# Patient Record
Sex: Male | Born: 1940 | Race: White | Hispanic: No | Smoking: Former smoker
Health system: Southern US, Community
[De-identification: ages and names within clinical notes are randomized; demographics above are authoritative.]

## PROBLEM LIST (undated history)

## (undated) DIAGNOSIS — I10 Essential (primary) hypertension: Secondary | ICD-10-CM

## (undated) DIAGNOSIS — I639 Cerebral infarction, unspecified: Secondary | ICD-10-CM

## (undated) DIAGNOSIS — E119 Type 2 diabetes mellitus without complications: Secondary | ICD-10-CM

## (undated) DIAGNOSIS — E785 Hyperlipidemia, unspecified: Secondary | ICD-10-CM

---

## 2008-06-30 ENCOUNTER — Ambulatory Visit: Payer: Self-pay | Admitting: Gastroenterology

## 2008-08-23 ENCOUNTER — Ambulatory Visit: Payer: Self-pay | Admitting: Cardiology

## 2008-10-04 ENCOUNTER — Ambulatory Visit: Payer: Self-pay | Admitting: Surgery

## 2008-10-11 ENCOUNTER — Inpatient Hospital Stay: Payer: Self-pay | Admitting: Surgery

## 2010-10-09 ENCOUNTER — Ambulatory Visit: Payer: Self-pay | Admitting: Family Medicine

## 2011-10-20 ENCOUNTER — Inpatient Hospital Stay: Payer: Self-pay | Admitting: Internal Medicine

## 2011-10-20 LAB — COMPREHENSIVE METABOLIC PANEL
Albumin: 3.1 g/dL — ABNORMAL LOW (ref 3.4–5.0)
Anion Gap: 15 (ref 7–16)
Calcium, Total: 9.1 mg/dL (ref 8.5–10.1)
Chloride: 97 mmol/L — ABNORMAL LOW (ref 98–107)
Creatinine: 1.64 mg/dL — ABNORMAL HIGH (ref 0.60–1.30)
EGFR (African American): 49 — ABNORMAL LOW

## 2011-10-20 LAB — URINALYSIS, COMPLETE
Bacteria: NONE SEEN
Blood: NEGATIVE
Glucose,UR: >=500 mg/dL (ref 0–75)
Hyaline Cast: 21
RBC,UR: NONE SEEN /HPF (ref 0–5)
Squamous Epithelial: <1

## 2011-10-20 LAB — CBC
MCV: 88 fL (ref 80–100)
Platelet: 351 10*3/uL (ref 150–440)
RDW: 12.8 % (ref 11.5–14.5)

## 2011-10-20 LAB — CK TOTAL AND CKMB (NOT AT ARMC): CK, Total: 51 U/L (ref 35–232)

## 2011-10-20 LAB — PRO B NATRIURETIC PEPTIDE: B-Type Natriuretic Peptide: 181 pg/mL — ABNORMAL HIGH (ref 0–125)

## 2011-10-20 LAB — AMYLASE: Amylase: 57 U/L (ref 25–115)

## 2011-10-21 LAB — CBC WITH DIFFERENTIAL/PLATELET
Basophil %: 0.2 %
Eosinophil %: 0.2 %
MCHC: 31.9 g/dL — ABNORMAL LOW (ref 32.0–36.0)
MCV: 88 fL (ref 80–100)
Monocyte %: 8.4 %

## 2011-10-21 LAB — TROPONIN I: Troponin-I: <0.02 ng/mL

## 2011-10-21 LAB — BASIC METABOLIC PANEL
BUN: 38 mg/dL — ABNORMAL HIGH (ref 7–18)
Potassium: 4.5 mmol/L (ref 3.5–5.1)

## 2011-10-21 LAB — CK TOTAL AND CKMB (NOT AT ARMC): CK, Total: 250 U/L — ABNORMAL HIGH (ref 35–232)

## 2011-10-22 DIAGNOSIS — I4891 Unspecified atrial fibrillation: Secondary | ICD-10-CM

## 2011-10-22 LAB — CBC WITH DIFFERENTIAL/PLATELET
Basophil #: 0.1 10*3/uL (ref 0.0–0.1)
Eosinophil #: 0.1 10*3/uL (ref 0.0–0.7)
Eosinophil %: 0.4 %
HGB: 11 g/dL — ABNORMAL LOW (ref 13.0–18.0)
Lymphocyte %: 4.2 %
MCH: 28.4 pg (ref 26.0–34.0)
MCHC: 32.1 g/dL (ref 32.0–36.0)
Monocyte #: 2 x10 3/mm — ABNORMAL HIGH (ref 0.2–1.0)
Monocyte %: 8.4 %
Neutrophil #: 20.3 10*3/uL — ABNORMAL HIGH (ref 1.4–6.5)
Neutrophil %: 86.8 %
Platelet: 324 10*3/uL (ref 150–440)
WBC: 23.4 10*3/uL — ABNORMAL HIGH (ref 3.8–10.6)

## 2011-10-22 LAB — BASIC METABOLIC PANEL
Anion Gap: 12 (ref 7–16)
Calcium, Total: 8.4 mg/dL — ABNORMAL LOW (ref 8.5–10.1)
Chloride: 99 mmol/L (ref 98–107)
Co2: 23 mmol/L (ref 21–32)
EGFR (African American): 33 — ABNORMAL LOW
EGFR (Non-African Amer.): 29 — ABNORMAL LOW
Glucose: 246 mg/dL — ABNORMAL HIGH (ref 65–99)
Osmolality: 290 (ref 275–301)
Sodium: 134 mmol/L — ABNORMAL LOW (ref 136–145)

## 2011-10-23 ENCOUNTER — Inpatient Hospital Stay (HOSPITAL_COMMUNITY): Payer: Medicare Other

## 2011-10-23 ENCOUNTER — Encounter (HOSPITAL_COMMUNITY): Payer: Self-pay | Admitting: Certified Registered"

## 2011-10-23 ENCOUNTER — Inpatient Hospital Stay (HOSPITAL_COMMUNITY)
Admission: EM | Admit: 2011-10-23 | Discharge: 2011-10-30 | DRG: 853 | Disposition: A | Discharge status: Home Health | Payer: Medicare Other | Source: Other Acute Inpatient Hospital | Attending: Internal Medicine | Admitting: Internal Medicine

## 2011-10-23 ENCOUNTER — Encounter (HOSPITAL_COMMUNITY)
Admission: EM | Disposition: A | Discharge status: Home / Self Care | Payer: Self-pay | Source: Other Acute Inpatient Hospital | Attending: Internal Medicine

## 2011-10-23 ENCOUNTER — Encounter (HOSPITAL_COMMUNITY): Payer: Self-pay | Admitting: Adult Health

## 2011-10-23 ENCOUNTER — Inpatient Hospital Stay (HOSPITAL_COMMUNITY): Payer: Medicare Other | Admitting: Certified Registered"

## 2011-10-23 DIAGNOSIS — E785 Hyperlipidemia, unspecified: Secondary | ICD-10-CM | POA: Diagnosis present

## 2011-10-23 DIAGNOSIS — R6520 Severe sepsis without septic shock: Secondary | ICD-10-CM | POA: Diagnosis present

## 2011-10-23 DIAGNOSIS — I4891 Unspecified atrial fibrillation: Secondary | ICD-10-CM

## 2011-10-23 DIAGNOSIS — I10 Essential (primary) hypertension: Secondary | ICD-10-CM | POA: Diagnosis present

## 2011-10-23 DIAGNOSIS — E119 Type 2 diabetes mellitus without complications: Secondary | ICD-10-CM | POA: Diagnosis present

## 2011-10-23 DIAGNOSIS — J90 Pleural effusion, not elsewhere classified: Secondary | ICD-10-CM | POA: Diagnosis present

## 2011-10-23 DIAGNOSIS — E876 Hypokalemia: Secondary | ICD-10-CM | POA: Diagnosis not present

## 2011-10-23 DIAGNOSIS — A419 Sepsis, unspecified organism: Principal | ICD-10-CM | POA: Diagnosis present

## 2011-10-23 DIAGNOSIS — J189 Pneumonia, unspecified organism: Secondary | ICD-10-CM | POA: Diagnosis present

## 2011-10-23 DIAGNOSIS — J869 Pyothorax without fistula: Secondary | ICD-10-CM

## 2011-10-23 DIAGNOSIS — E871 Hypo-osmolality and hyponatremia: Secondary | ICD-10-CM | POA: Diagnosis not present

## 2011-10-23 DIAGNOSIS — D649 Anemia, unspecified: Secondary | ICD-10-CM | POA: Diagnosis present

## 2011-10-23 DIAGNOSIS — Z87891 Personal history of nicotine dependence: Secondary | ICD-10-CM

## 2011-10-23 DIAGNOSIS — N179 Acute kidney failure, unspecified: Secondary | ICD-10-CM | POA: Diagnosis present

## 2011-10-23 DIAGNOSIS — J9600 Acute respiratory failure, unspecified whether with hypoxia or hypercapnia: Secondary | ICD-10-CM | POA: Diagnosis present

## 2011-10-23 HISTORY — DX: Hyperlipidemia, unspecified: E78.5

## 2011-10-23 HISTORY — DX: Type 2 diabetes mellitus without complications: E11.9

## 2011-10-23 HISTORY — DX: Essential (primary) hypertension: I10

## 2011-10-23 HISTORY — PX: DECORTICATION: SHX5101

## 2011-10-23 HISTORY — PX: PLEURAL EFFUSION DRAINAGE: SHX5099

## 2011-10-23 HISTORY — PX: THORACOTOMY: SHX5074

## 2011-10-23 HISTORY — PX: FLEXIBLE BRONCHOSCOPY: SHX5094

## 2011-10-23 LAB — CBC WITH DIFFERENTIAL/PLATELET
Eosinophil #: 0.1 10*3/uL (ref 0.0–0.7)
HCT: 32.4 % — ABNORMAL LOW (ref 40.0–52.0)
HGB: 10.3 g/dL — ABNORMAL LOW (ref 13.0–18.0)
Lymphocyte #: 1.1 10*3/uL (ref 1.0–3.6)
Lymphocyte %: 4.9 %
MCH: 28.2 pg (ref 26.0–34.0)
MCHC: 31.8 g/dL — ABNORMAL LOW (ref 32.0–36.0)
MCV: 89 fL (ref 80–100)

## 2011-10-23 LAB — CORTISOL: Cortisol, Plasma: 28.2 ug/dL

## 2011-10-23 LAB — URINALYSIS, ROUTINE W REFLEX MICROSCOPIC
Glucose, UA: NEGATIVE mg/dL
Ketones, ur: NEGATIVE mg/dL
Leukocytes, UA: NEGATIVE
Protein, ur: 30 mg/dL — AB
pH: 5 (ref 5.0–8.0)

## 2011-10-23 LAB — URINE MICROSCOPIC-ADD ON

## 2011-10-23 LAB — COMPREHENSIVE METABOLIC PANEL
Alkaline Phosphatase: 89 U/L (ref 39–117)
BUN: 75 mg/dL — ABNORMAL HIGH (ref 6–23)
CO2: 21 mEq/L (ref 19–32)
Calcium: 8.9 mg/dL (ref 8.4–10.5)
GFR calc Af Amer: 39 mL/min — ABNORMAL LOW (ref >90)
GFR calc non Af Amer: 33 mL/min — ABNORMAL LOW (ref >90)
Glucose, Bld: 294 mg/dL — ABNORMAL HIGH (ref 70–99)
Total Protein: 7.3 g/dL (ref 6.0–8.3)

## 2011-10-23 LAB — POCT I-STAT 3, ART BLOOD GAS (G3+)
Acid-base deficit: 3 mmol/L — ABNORMAL HIGH (ref 0.0–2.0)
Bicarbonate: 22.7 mEq/L (ref 20.0–24.0)
TCO2: 24 mmol/L (ref 0–100)
pO2, Arterial: 90 mmHg (ref 80.0–100.0)

## 2011-10-23 LAB — POCT I-STAT 4, (NA,K, GLUC, HGB,HCT)
Glucose, Bld: 295 mg/dL — ABNORMAL HIGH (ref 70–99)
Hemoglobin: 10.2 g/dL — ABNORMAL LOW (ref 13.0–17.0)
Potassium: 4.4 mEq/L (ref 3.5–5.1)

## 2011-10-23 LAB — BASIC METABOLIC PANEL
Anion Gap: 11 (ref 7–16)
Calcium, Total: 8.6 mg/dL (ref 8.5–10.1)
Co2: 22 mmol/L (ref 21–32)
EGFR (Non-African Amer.): 27 — ABNORMAL LOW
Glucose: 270 mg/dL — ABNORMAL HIGH (ref 65–99)
Osmolality: 293 (ref 275–301)
Potassium: 4.6 mmol/L (ref 3.5–5.1)

## 2011-10-23 LAB — CBC
HCT: 33.8 % — ABNORMAL LOW (ref 39.0–52.0)
Hemoglobin: 10.9 g/dL — ABNORMAL LOW (ref 13.0–17.0)
MCH: 28.2 pg (ref 26.0–34.0)
MCHC: 32.2 g/dL (ref 30.0–36.0)

## 2011-10-23 LAB — MRSA PCR SCREENING: MRSA by PCR: NEGATIVE

## 2011-10-23 LAB — LACTIC ACID, PLASMA: Lactic Acid, Venous: 1 mmol/L (ref 0.5–2.2)

## 2011-10-23 LAB — PHOSPHORUS: Phosphorus: 3.9 mg/dL (ref 2.3–4.6)

## 2011-10-23 LAB — TYPE AND SCREEN: Antibody Screen: NEGATIVE

## 2011-10-23 LAB — PROCALCITONIN: Procalcitonin: 11.1 ng/mL

## 2011-10-23 SURGERY — BRONCHOSCOPY, FLEXIBLE
Anesthesia: General | Site: Chest | Laterality: Right | Wound class: Clean Contaminated

## 2011-10-23 MED ORDER — MIDAZOLAM HCL 5 MG/5ML IJ SOLN
INTRAMUSCULAR | Status: DC | PRN
  Administered 2011-10-23 (×2): 2 mg via INTRAVENOUS

## 2011-10-23 MED ORDER — FENTANYL CITRATE 0.05 MG/ML IJ SOLN
25.0000 ug | INTRAMUSCULAR | Status: DC | PRN
  Administered 2011-10-23: 25 ug via INTRAVENOUS

## 2011-10-23 MED ORDER — LIDOCAINE HCL (CARDIAC) 20 MG/ML IV SOLN
INTRAVENOUS | Status: DC | PRN
  Administered 2011-10-23: 100 mg via INTRAVENOUS

## 2011-10-23 MED ORDER — MIDAZOLAM BOLUS VIA INFUSION
1.0000 mg | INTRAVENOUS | Status: DC | PRN
  Filled 2011-10-23: qty 2

## 2011-10-23 MED ORDER — SUFENTANIL CITRATE 50 MCG/ML IV SOLN
INTRAVENOUS | Status: DC | PRN
  Administered 2011-10-23: 15 ug via INTRAVENOUS
  Administered 2011-10-23 (×2): 10 ug via INTRAVENOUS
  Administered 2011-10-23: 15 ug via INTRAVENOUS

## 2011-10-23 MED ORDER — FENTANYL CITRATE 0.05 MG/ML IJ SOLN
INTRAMUSCULAR | Status: DC | PRN
  Administered 2011-10-23: 50 ug via INTRAVENOUS
  Administered 2011-10-23 (×2): 100 ug via INTRAVENOUS

## 2011-10-23 MED ORDER — LINEZOLID 2 MG/ML IV SOLN
600.0000 mg | Freq: Two times a day (BID) | INTRAVENOUS | Status: DC
  Administered 2011-10-23 – 2011-10-29 (×12): 600 mg via INTRAVENOUS
  Filled 2011-10-23 (×15): qty 300

## 2011-10-23 MED ORDER — VECURONIUM BROMIDE 10 MG IV SOLR
INTRAVENOUS | Status: DC | PRN
  Administered 2011-10-23: 4 mg via INTRAVENOUS
  Administered 2011-10-23: 6 mg via INTRAVENOUS

## 2011-10-23 MED ORDER — INSULIN REGULAR HUMAN 100 UNIT/ML IJ SOLN
INTRAMUSCULAR | Status: DC
  Filled 2011-10-23: qty 1

## 2011-10-23 MED ORDER — INSULIN ASPART 100 UNIT/ML ~~LOC~~ SOLN: 0.0000 [IU] | SUBCUTANEOUS | Status: DC

## 2011-10-23 MED ORDER — HYDROMORPHONE HCL PF 1 MG/ML IJ SOLN: 0.2500 mg | INTRAMUSCULAR | Status: DC | PRN

## 2011-10-23 MED ORDER — CHLORHEXIDINE GLUCONATE 0.12 % MT SOLN
15.0000 mL | Freq: Two times a day (BID) | OROMUCOSAL | Status: DC
  Administered 2011-10-24 – 2011-10-25 (×4): 15 mL via OROMUCOSAL
  Filled 2011-10-23 (×4): qty 15

## 2011-10-23 MED ORDER — SODIUM CHLORIDE 0.9 % IV SOLN
50.0000 ug/h | INTRAVENOUS | Status: DC
  Administered 2011-10-23: 100 ug/h via INTRAVENOUS
  Filled 2011-10-23: qty 50

## 2011-10-23 MED ORDER — SUCCINYLCHOLINE CHLORIDE 20 MG/ML IJ SOLN
INTRAMUSCULAR | Status: DC | PRN
  Administered 2011-10-23: 100 mg via INTRAVENOUS

## 2011-10-23 MED ORDER — SENNOSIDES-DOCUSATE SODIUM 8.6-50 MG PO TABS
1.0000 | ORAL_TABLET | Freq: Every evening | ORAL | Status: DC | PRN
  Filled 2011-10-23: qty 1

## 2011-10-23 MED ORDER — INSULIN ASPART 100 UNIT/ML ~~LOC~~ SOLN
0.0000 [IU] | SUBCUTANEOUS | Status: DC
  Administered 2011-10-24: 12 [IU] via SUBCUTANEOUS
  Administered 2011-10-24 (×2): 8 [IU] via SUBCUTANEOUS
  Administered 2011-10-24: 12 [IU] via SUBCUTANEOUS
  Administered 2011-10-24: 4 [IU] via SUBCUTANEOUS
  Administered 2011-10-24 – 2011-10-25 (×2): 8 [IU] via SUBCUTANEOUS
  Administered 2011-10-25: 4 [IU] via SUBCUTANEOUS
  Administered 2011-10-25 (×2): 8 [IU] via SUBCUTANEOUS
  Administered 2011-10-25 (×2): 4 [IU] via SUBCUTANEOUS
  Administered 2011-10-26: 8 [IU] via SUBCUTANEOUS
  Administered 2011-10-26 (×3): 4 [IU] via SUBCUTANEOUS
  Administered 2011-10-26: 8 [IU] via SUBCUTANEOUS
  Administered 2011-10-26 (×2): 2 [IU] via SUBCUTANEOUS
  Administered 2011-10-27: 4 [IU] via SUBCUTANEOUS
  Administered 2011-10-27: 8 [IU] via SUBCUTANEOUS
  Administered 2011-10-27 (×2): 4 [IU] via SUBCUTANEOUS
  Administered 2011-10-27: 8 [IU] via SUBCUTANEOUS
  Administered 2011-10-28 (×2): 4 [IU] via SUBCUTANEOUS

## 2011-10-23 MED ORDER — FENTANYL CITRATE 0.05 MG/ML IJ SOLN
INTRAMUSCULAR | Status: AC
  Filled 2011-10-23: qty 2

## 2011-10-23 MED ORDER — SODIUM CHLORIDE 0.9 % IV SOLN
INTRAVENOUS | Status: DC
  Administered 2011-10-23: 15:00:00 via INTRAVENOUS

## 2011-10-23 MED ORDER — FENTANYL BOLUS VIA INFUSION
50.0000 ug | Freq: Four times a day (QID) | INTRAVENOUS | Status: DC | PRN
  Filled 2011-10-23: qty 100

## 2011-10-23 MED ORDER — VANCOMYCIN HCL 1000 MG IV SOLR
1500.0000 mg | INTRAVENOUS | Status: DC
  Filled 2011-10-23: qty 1500

## 2011-10-23 MED ORDER — PIPERACILLIN-TAZOBACTAM 3.375 G IVPB
3.3750 g | Freq: Three times a day (TID) | INTRAVENOUS | Status: DC
  Administered 2011-10-24 – 2011-10-29 (×17): 3.375 g via INTRAVENOUS
  Filled 2011-10-23 (×22): qty 50

## 2011-10-23 MED ORDER — OXYCODONE HCL 5 MG PO TABS
5.0000 mg | ORAL_TABLET | ORAL | Status: AC | PRN
Start: 2011-10-23 — End: 2011-10-24

## 2011-10-23 MED ORDER — 0.9 % SODIUM CHLORIDE (POUR BTL) OPTIME
TOPICAL | Status: DC | PRN
  Administered 2011-10-23: 1000 mL

## 2011-10-23 MED ORDER — BISACODYL 5 MG PO TBEC
10.0000 mg | DELAYED_RELEASE_TABLET | Freq: Every day | ORAL | Status: DC
  Administered 2011-10-25 – 2011-10-27 (×2): 10 mg via ORAL
  Filled 2011-10-23: qty 1
  Filled 2011-10-23: qty 2
  Filled 2011-10-23: qty 1
  Filled 2011-10-23: qty 2

## 2011-10-23 MED ORDER — BIOTENE DRY MOUTH MT LIQD
15.0000 mL | Freq: Four times a day (QID) | OROMUCOSAL | Status: DC
  Administered 2011-10-24 – 2011-10-25 (×7): 15 mL via OROMUCOSAL

## 2011-10-23 MED ORDER — ONDANSETRON HCL 4 MG/2ML IJ SOLN
4.0000 mg | Freq: Four times a day (QID) | INTRAMUSCULAR | Status: DC | PRN
  Administered 2011-10-24: 4 mg via INTRAVENOUS
  Filled 2011-10-23: qty 2

## 2011-10-23 MED ORDER — PROPOFOL 10 MG/ML IV EMUL
INTRAVENOUS | Status: DC | PRN
  Administered 2011-10-23: 100 mg via INTRAVENOUS

## 2011-10-23 MED ORDER — ACETAMINOPHEN 10 MG/ML IV SOLN
1000.0000 mg | Freq: Four times a day (QID) | INTRAVENOUS | Status: AC
  Administered 2011-10-24 (×4): 1000 mg via INTRAVENOUS
  Filled 2011-10-23 (×4): qty 100

## 2011-10-23 MED ORDER — LEVALBUTEROL HCL 0.63 MG/3ML IN NEBU
0.6300 mg | INHALATION_SOLUTION | Freq: Four times a day (QID) | RESPIRATORY_TRACT | Status: DC
  Administered 2011-10-24 – 2011-10-30 (×20): 0.63 mg via RESPIRATORY_TRACT
  Filled 2011-10-23 (×32): qty 3

## 2011-10-23 MED ORDER — DEXTROSE 10 % IV SOLN: INTRAVENOUS | Status: DC | PRN

## 2011-10-23 MED ORDER — SODIUM CHLORIDE 0.9 % IV SOLN
250.0000 mL | INTRAVENOUS | Status: DC | PRN
  Administered 2011-10-23: 19:00:00 via INTRAVENOUS

## 2011-10-23 MED ORDER — SODIUM CHLORIDE 0.9 % IV SOLN
INTRAVENOUS | Status: DC
  Administered 2011-10-24: 125 mL/h via INTRAVENOUS
  Administered 2011-10-25 – 2011-10-27 (×2): via INTRAVENOUS
  Administered 2011-10-28: 20 mL/h via INTRAVENOUS
  Administered 2011-10-28: 04:00:00 via INTRAVENOUS

## 2011-10-23 MED ORDER — PIPERACILLIN-TAZOBACTAM 3.375 G IVPB 30 MIN
3.3750 g | INTRAVENOUS | Status: AC
  Administered 2011-10-23: 3.375 g via INTRAVENOUS
  Filled 2011-10-23: qty 50

## 2011-10-23 MED ORDER — INSULIN ASPART 100 UNIT/ML ~~LOC~~ SOLN
0.0000 [IU] | SUBCUTANEOUS | Status: DC
  Administered 2011-10-23: 7 [IU] via SUBCUTANEOUS

## 2011-10-23 MED ORDER — SODIUM CHLORIDE 0.9 % IV SOLN
2.0000 mg/h | INTRAVENOUS | Status: DC
  Administered 2011-10-23: 2 mg/h via INTRAVENOUS
  Filled 2011-10-23: qty 10

## 2011-10-23 MED ORDER — PANTOPRAZOLE SODIUM 40 MG IV SOLR
40.0000 mg | Freq: Every day | INTRAVENOUS | Status: DC
  Administered 2011-10-24 – 2011-10-27 (×4): 40 mg via INTRAVENOUS
  Filled 2011-10-23 (×5): qty 40

## 2011-10-23 MED ORDER — DOCUSATE SODIUM 100 MG PO CAPS
100.0000 mg | ORAL_CAPSULE | Freq: Two times a day (BID) | ORAL | Status: DC
Start: 2011-10-23 — End: 2011-10-23
  Administered 2011-10-23: 100 mg via ORAL
  Filled 2011-10-23: qty 1

## 2011-10-23 MED ORDER — BISACODYL 10 MG RE SUPP: 10.0000 mg | Freq: Every day | RECTAL | Status: DC | PRN

## 2011-10-23 MED ORDER — OXYCODONE-ACETAMINOPHEN 5-325 MG PO TABS
1.0000 | ORAL_TABLET | ORAL | Status: DC | PRN
  Administered 2011-10-25: 2 via ORAL
  Administered 2011-10-25: 1 via ORAL
  Administered 2011-10-25: 2 via ORAL
  Administered 2011-10-25 – 2011-10-26 (×2): 1 via ORAL
  Administered 2011-10-26 – 2011-10-27 (×3): 2 via ORAL
  Administered 2011-10-27 (×2): 1 via ORAL
  Administered 2011-10-27: 2 via ORAL
  Administered 2011-10-27 – 2011-10-28 (×5): 1 via ORAL
  Administered 2011-10-28: 2 via ORAL
  Administered 2011-10-28 – 2011-10-29 (×2): 1 via ORAL
  Filled 2011-10-23 (×6): qty 1
  Filled 2011-10-23 (×5): qty 2
  Filled 2011-10-23 (×3): qty 1
  Filled 2011-10-23 (×2): qty 2
  Filled 2011-10-23 (×3): qty 1

## 2011-10-23 MED ORDER — ONDANSETRON HCL 4 MG/2ML IJ SOLN
INTRAMUSCULAR | Status: DC | PRN
  Administered 2011-10-23: 4 mg via INTRAVENOUS

## 2011-10-23 MED ORDER — LACTULOSE 10 GM/15ML PO SOLN
20.0000 g | Freq: Once | ORAL | Status: AC
  Administered 2011-10-23: 20 g via ORAL
  Filled 2011-10-23: qty 30

## 2011-10-23 MED ORDER — FENTANYL CITRATE 0.05 MG/ML IJ SOLN: 25.0000 ug | INTRAMUSCULAR | Status: DC | PRN

## 2011-10-23 MED ORDER — SODIUM CHLORIDE 0.9 % IV SOLN
100.0000 [IU] | INTRAVENOUS | Status: DC | PRN
  Administered 2011-10-23: 7.1 [IU]/h via INTRAVENOUS

## 2011-10-23 MED ORDER — HEMOSTATIC AGENTS (NO CHARGE) OPTIME
TOPICAL | Status: DC | PRN
  Administered 2011-10-23: 1 via TOPICAL

## 2011-10-23 MED ORDER — ONDANSETRON HCL 4 MG/2ML IJ SOLN: 4.0000 mg | Freq: Once | INTRAMUSCULAR | Status: AC | PRN

## 2011-10-23 SURGICAL SUPPLY — 77 items
BRUSH CYTOL CELLEBRITY 1.5X140 (MISCELLANEOUS) IMPLANT
CANISTER SUCTION 2500CC (MISCELLANEOUS) ×6 IMPLANT
CATH KIT ON Q 5IN SLV (PAIN MANAGEMENT) IMPLANT
CATH THORACIC 28FR (CATHETERS) IMPLANT
CATH THORACIC 36FR (CATHETERS) IMPLANT
CATH THORACIC 36FR RT ANG (CATHETERS) IMPLANT
CLOTH BEACON ORANGE TIMEOUT ST (SAFETY) ×3 IMPLANT
CONN ST 1/4X3/8  BEN (MISCELLANEOUS) ×2
CONN ST 1/4X3/8 BEN (MISCELLANEOUS) ×4 IMPLANT
CONN Y 3/8X3/8X3/8  BEN (MISCELLANEOUS) ×1
CONN Y 3/8X3/8X3/8 BEN (MISCELLANEOUS) ×2 IMPLANT
CONT SPEC 4OZ CLIKSEAL STRL BL (MISCELLANEOUS) ×6 IMPLANT
COTTONBALL LRG STERILE PKG (GAUZE/BANDAGES/DRESSINGS) IMPLANT
COVER SURGICAL LIGHT HANDLE (MISCELLANEOUS) ×6 IMPLANT
COVER TABLE BACK 60X90 (DRAPES) ×3 IMPLANT
DRAIN CHANNEL 32F RND 10.7 FF (WOUND CARE) ×6 IMPLANT
DRAPE LAPAROSCOPIC ABDOMINAL (DRAPES) ×3 IMPLANT
DRAPE SLUSH MACHINE 52X66 (DRAPES) IMPLANT
DRAPE SLUSH/WARMER DISC (DRAPES) ×3 IMPLANT
ELECT REM PT RETURN 9FT ADLT (ELECTROSURGICAL) ×3
ELECTRODE REM PT RTRN 9FT ADLT (ELECTROSURGICAL) ×2 IMPLANT
FORCEPS BIOP RJ4 1.8 (CUTTING FORCEPS) IMPLANT
GLOVE BIO SURGEON STRL SZ 6 (GLOVE) ×6 IMPLANT
GLOVE BIO SURGEON STRL SZ 6.5 (GLOVE) ×3 IMPLANT
GLOVE BIOGEL PI IND STRL 7.0 (GLOVE) ×2 IMPLANT
GLOVE BIOGEL PI IND STRL 7.5 (GLOVE) ×2 IMPLANT
GLOVE BIOGEL PI INDICATOR 7.0 (GLOVE) ×1
GLOVE BIOGEL PI INDICATOR 7.5 (GLOVE) ×1
GLOVE EUDERMIC 7 POWDERFREE (GLOVE) ×6 IMPLANT
GOWN PREVENTION PLUS XLARGE (GOWN DISPOSABLE) ×3 IMPLANT
GOWN STRL NON-REIN LRG LVL3 (GOWN DISPOSABLE) ×6 IMPLANT
KIT BASIN OR (CUSTOM PROCEDURE TRAY) ×3 IMPLANT
KIT ROOM TURNOVER OR (KITS) ×3 IMPLANT
MARKER SKIN DUAL TIP RULER LAB (MISCELLANEOUS) IMPLANT
NEEDLE 22X1 1/2 (OR ONLY) (NEEDLE) IMPLANT
NEEDLE BIOPSY TRANSBRONCH 21G (NEEDLE) IMPLANT
NS IRRIG 1000ML POUR BTL (IV SOLUTION) ×6 IMPLANT
OIL SILICONE PENTAX (PARTS (SERVICE/REPAIRS)) ×3 IMPLANT
PACK CHEST (CUSTOM PROCEDURE TRAY) ×3 IMPLANT
PAD ARMBOARD 7.5X6 YLW CONV (MISCELLANEOUS) ×9 IMPLANT
SEALANT PROGEL (MISCELLANEOUS) IMPLANT
SEALANT SURG COSEAL 4ML (VASCULAR PRODUCTS) IMPLANT
SEALANT SURG COSEAL 8ML (VASCULAR PRODUCTS) ×3 IMPLANT
SOLUTION ANTI FOG 6CC (MISCELLANEOUS) ×3 IMPLANT
SPONGE GAUZE 4X4 12PLY (GAUZE/BANDAGES/DRESSINGS) ×3 IMPLANT
SUT PROLENE 3 0 SH DA (SUTURE) IMPLANT
SUT SILK  1 MH (SUTURE) ×2
SUT SILK 1 MH (SUTURE) ×4 IMPLANT
SUT SILK 2 0 SH (SUTURE) ×3 IMPLANT
SUT SILK 2 0SH CR/8 30 (SUTURE) IMPLANT
SUT SILK 3 0SH CR/8 30 (SUTURE) IMPLANT
SUT VIC AB 1 CTX 36 (SUTURE) ×1
SUT VIC AB 1 CTX36XBRD ANBCTR (SUTURE) ×2 IMPLANT
SUT VIC AB 2-0 CT1 27 (SUTURE)
SUT VIC AB 2-0 CT1 TAPERPNT 27 (SUTURE) IMPLANT
SUT VIC AB 2-0 CTX 36 (SUTURE) ×3 IMPLANT
SUT VIC AB 3-0 MH 27 (SUTURE) IMPLANT
SUT VIC AB 3-0 SH 27 (SUTURE)
SUT VIC AB 3-0 SH 27X BRD (SUTURE) IMPLANT
SUT VIC AB 3-0 X1 27 (SUTURE) ×3 IMPLANT
SUT VICRYL 2 TP 1 (SUTURE) ×3 IMPLANT
SYR 20ML ECCENTRIC (SYRINGE) ×3 IMPLANT
SYR 5ML LUER SLIP (SYRINGE) IMPLANT
SYR CONTROL 10ML LL (SYRINGE) IMPLANT
SYSTEM SAHARA CHEST DRAIN ATS (WOUND CARE) ×3 IMPLANT
TAPE CLOTH SURG 4X10 WHT LF (GAUZE/BANDAGES/DRESSINGS) ×3 IMPLANT
TIP APPLICATOR SPRAY EXTEND 16 (VASCULAR PRODUCTS) IMPLANT
TOWEL OR 17X24 6PK STRL BLUE (TOWEL DISPOSABLE) ×3 IMPLANT
TOWEL OR 17X26 10 PK STRL BLUE (TOWEL DISPOSABLE) ×3 IMPLANT
TRAP SPECIMEN MUCOUS 40CC (MISCELLANEOUS) ×3 IMPLANT
TRAY FOLEY CATH 14FRSI W/METER (CATHETERS) ×3 IMPLANT
TUBE CONNECTING 12X1/4 (SUCTIONS) ×3 IMPLANT
TUNNELER SHEATH ON-Q 11GX8 (MISCELLANEOUS) IMPLANT
VALVE BIOPSY  SINGLE USE (MISCELLANEOUS)
VALVE BIOPSY SINGLE USE (MISCELLANEOUS) IMPLANT
VALVE SUCTION BRONCHIO DISP (MISCELLANEOUS) IMPLANT
WATER STERILE IRR 1000ML POUR (IV SOLUTION) ×6 IMPLANT

## 2011-10-23 NOTE — Progress Notes (Signed)
eLink Physician-Brief Progress Note Patient Name: Garison Genova DOB: 04-28-1941 MRN: [id number]  Date of Service  10/23/2011   HPI/Events of Note   Pt back from OR s/p VATS empyema drainage.   eICU Interventions  See vent and post op orders in place    Intervention Category Major Interventions: Respiratory failure - evaluation and management  Shan Levans 10/23/2011, 10:16 PM

## 2011-10-23 NOTE — Preoperative (Signed)
Beta Blockers   Reason not to administer Beta Blockers:Not Applicable 

## 2011-10-23 NOTE — Brief Op Note (Signed)
10/23/2011  9:22 PM  PATIENT:  Peter Nunez  71 y.o. male  PRE-OPERATIVE DIAGNOSIS:  Epyema  POST-OPERATIVE DIAGNOSIS:  empyema/ pleural effusion  PROCEDURE:  Procedure(s) (LRB): FLEXIBLE BRONCHOSCOPY (N/A) THORACOTOMY MAJOR (Right) DRAINAGE OF Empyema DECORTICATION of right lung  SURGEON:  Surgeon(s) and Role:    * Alleen Borne, MD - Primary  PHYSICIAN ASSISTANT: none   ASSISTANTS: Teryl Lucy, RNFA  ANESTHESIA:   general  EBL:  Total I/O In: -  Out: 450 [Urine:200; Blood:250]  BLOOD ADMINISTERED:none  DRAINS: 2 Chest Tube(s) in the right   LOCAL MEDICATIONS USED:  NONE  SPECIMEN:  Source of Specimen:  cultures of empyema  DISPOSITION OF SPECIMEN:  micro  COUNTS:  YES   PLAN OF CARE: Admit to inpatient   PATIENT DISPOSITION:  ICU - intubated and hemodynamically stable.   Delay start of Pharmacological VTE agent (>24hrs) due to surgical blood loss or risk of bleeding: yes

## 2011-10-23 NOTE — OR Nursing (Signed)
Flexible bronchoscopy ended at 1921.  Patient was repositioned.  Start time for Thoracotomy 1957.

## 2011-10-23 NOTE — H&P (Signed)
Name: Peter Nunez MRN: [id number] DOB: [birth year]    LOS: 0   PULMONARY / CRITICAL CARE MEDICINE  HPI:  71yo male with hx HTN, DM presented 6/2 to [facility] hospital with 2 week hx cough, fevers (up to 106), chills and increasing SOB.  He was admitted with PNA and treated with Rocephin/Azithro but cont to worsen and CT chest revealed large R effusion v empyema and pt was tx to [facility] for further eval.   Of note saw his PCP in "early spring" and was rx with Z-pac for pulmonary infection.    Past Medical History  Diagnosis Date  . HTN (hypertension)   . Diabetes mellitus   . Hyperlipidemia    No past surgical history on file. Prior to Admission medications   Not on File  He also reported colectomy for polyp?  Allergies Allergies not on file  Family History No family history on file. Social History  reports that he quit smoking about 31 years ago. His smoking use included Cigarettes. He has a 20 pack-year smoking history. He does not have any smokeless tobacco history on file. He reports that he does not drink alcohol. His drug history not on file.  Review Of Systems:  C/o R sided pleuritic chest pain, SOB, cough.  Denies hemoptysis, leg/calf pain, headache.  All other systems reviewed and were neg.     Vital Signs: Temp:  [99 F (37.2 C)] 99 F (37.2 C) (06/05 1330) Pulse Rate:  [88-91] 91  (06/05 1400) Resp:  [18-30] 30  (06/05 1400) BP: (124-158)/(68-75) 158/75 mmHg (06/05 1400) SpO2:  [97 %-98 %] 97 % (06/05 1400) FiO2 (%):  [100 %] 100 % (06/05 1400) Weight:  [251 lb 15.8 oz (114.3 kg)] 251 lb 15.8 oz (114.3 kg) (06/05 1330)  Physical Examination: General: chronically ill appearing male, mild distress, toxic appearance Neuro: awake, alert, MAE  CV: s1s2 rrr PULM: resps even, mildl-moderate labored on NRB, crackles anteriorly nipple line up, diminished below and significantly diminished R base, L clear  GI: abd round, mildly distended, +bs Extremities:  Warm and dry, no  edema    Active Problems:  HTN (hypertension)  Diabetes mellitus  Acute respiratory failure  Empyema  Acute renal failure   ASSESSMENT AND PLAN  PULMONARY  CXR:  Pending  A:  Acute respiratory failure  PNA/ Pleural effusion/ empyema  P:   CVTS to see - likely needs VATS  O2 as needed - monitor resp status closely as he is high risk for intubation  Trial bipap to rest his muscles of respiration , abg with mild acidosis I am concerned he needs at minimum a chest tube for source control NOW, I will page CVTS to discuss timing of likely VATS and preference for chest tube placement CXR now  If sample requested by CVTS, will place CT now  CARDIOVASCULAR  ECG:  NSR Lines:   A: SIRS P:  Check lactate, pct, cortisol  Tele ecg x 1  RENAL No results found for this basename: NA:5,K:2,CL:5,CO2:5,BUN:5,CREATININE:5,CALCIUM:5,MG:5,PHOS:5 in the last 168 hours Intake/Output      06/04 0701 - 06/05 0700 06/05 0701 - 06/06 0700   Urine (mL/kg/hr)  50 (0.1)   Total Output  50   Net  -50         Foley:  6/5  A:  Acute renal failure - in setting SIRS / ATB P:   Gentle volume  Pharmacy to dose abx  F/u chem now Likely needs line, cvp Renal US  GASTROINTESTINAL  A:  No active issue  P:   PPI  Npo lft  HEMATOLOGIC No results found for this basename: HGB:5,HCT:5,PLT:5,INR:5,APTT:5 in the last 168 hours A:   P:  Labs pending   INFECTIOUS No results found for this basename: WBC:5,PROCALCITON:5 in the last 168 hours Cultures: BCx2 6/3 ([place])>>> BC x 2 6/5>>> Urine 6/5>>> Sputum 6/5>>>   Antibiotics: Linezolid 6/5>>> Zosyn 6/5>>>  A:  PNA/empyema  P:   Pan culture  F/u culture data from [place]  Broad spectrum abx has been on vanc for days Check lactate, pct  See pulm  cvts called  ENDOCRINE No results found for this basename: GLUCAP:5 in the last 168 hours A:  DM P:   ICU hyperglycemia protocol   NEUROLOGIC  A:  No acute issue P:     Supportive care   BEST PRACTICE / DISPOSITION Level of Care:  icu Primary Service:  pccm Consultants:  CVTS Code Status:  full Diet:  npo DVT Px:  scd GI Px:  ppi Skin Integrity:  intact Social / Family:  Pt updated   Danford Bad, NP Pulmonary and Critical Care Medicine [facility] Pager: [phone]   I have fully examined this patient and agree with above findings.    And edited in full  Ccm 35 min   Mcarthur Rossetti. Tyson Alias, MD, FACP Pgr: [phone] [facility]

## 2011-10-23 NOTE — Progress Notes (Signed)
ANTIBIOTIC CONSULT NOTE - INITIAL  Pharmacy Consult for Vancomycin, Zosyn Indication: pneumonia and R effusion vs. empyema  Allergies not on file  Patient Measurements: Height: 6' (182.9 cm) Weight: 251 lb 15.8 oz (114.3 kg) IBW/kg (Calculated) : 77.6   Vital Signs: Temp: 99 F (37.2 C) (06/05 1330) Temp src: Oral (06/05 1330) BP: 158/75 mmHg (06/05 1400) Pulse Rate: 91  (06/05 1400)  Medical History: Past Medical History  Diagnosis Date  . HTN (hypertension)   . Diabetes mellitus   . Hyperlipidemia    Assessment: Pt transferred from [facility] with 3-d hx of cough, fever, chills and SOB. He was being treated with rocephin/azithro for PNA, but continued to worsen. Chest CT revealed R effusion vs. Empyema- patient was transferred here for further eval.  Noted Vanc and Zosyn initiated at OSH- Vanc 1250mg  q 24h (given 1945-6/4), Zosyn 3.375gm ext infusion dosing (given 0630-6/5). Received orders to continue broad spectrum abx.  Noted blood cx done at OSH 6/2, results not in records here; Labs as of this AM include Scr 2.4 (GFR ~30), WBC 23.5K, H/H 10.3/32.4, Plts 356.  Goal of Therapy:  Vancomycin trough level 15-20 mcg/ml  Plan:  1. Zosyn 3.375gm IV Q8h, each dose infused over 4 hours. 2. Vancomycin 1500mg  IV q 24 3. Will start abx now 4. Will f/up cx/sens and clinical status along with you.   Mirna Mires K 10/23/2011,2:08 PM

## 2011-10-23 NOTE — Anesthesia Preprocedure Evaluation (Addendum)
Anesthesia Evaluation  Patient identified by MRN, date of birth, ID band Patient awake    Reviewed: Allergy & Precautions, H&P , NPO status , Patient's Chart, lab work & pertinent test results  Airway Mallampati: II TM Distance: >3 FB Neck ROM: Full    Dental  (+) Teeth Intact   Pulmonary          Cardiovascular hypertension, Pt. on medications     Neuro/Psych    GI/Hepatic   Endo/Other  Diabetes mellitus-, Poorly Controlled, Type 2, Insulin Dependent  Renal/GU      Musculoskeletal   Abdominal   Peds  Hematology   Anesthesia Other Findings   Reproductive/Obstetrics                          Anesthesia Physical Anesthesia Plan  ASA: III and Emergent  Anesthesia Plan: General   Post-op Pain Management:    Induction: Intravenous, Rapid sequence and Cricoid pressure planned  Airway Management Planned: Double Lumen EBT and Oral ETT  Additional Equipment: Arterial line and CVP  Intra-op Plan:   Post-operative Plan: Post-operative intubation/ventilation and Extubation in OR  Informed Consent: I have reviewed the patients History and Physical, chart, labs and discussed the procedure including the risks, benefits and alternatives for the proposed anesthesia with the patient or authorized representative who has indicated his/her understanding and acceptance.   Dental advisory given  Plan Discussed with: CRNA and Surgeon  Anesthesia Plan Comments:        Anesthesia Quick Evaluation

## 2011-10-23 NOTE — Progress Notes (Signed)
Chaplain Note:  Chaplain visited with pt and pt's daughter.  Pt was reclining in bed, awake, and alert.  Pt's daughter was at bedside. Pt was anxious about impending surgery.  In conversation with him it was clear that he relies on faith as a source of strength. Chaplain provided spiritual comfort, support, and prayer for pt and family.  Both expressed appreciation for chaplain support.  Chaplain will follow up as needed.  10/23/11 1600  Clinical Encounter Type  Visited With Patient and family together  Visit Type Spiritual support  Referral From Other (Comment) (Takeira Yanes-referral)  Spiritual Encounters  Spiritual Needs Prayer;Emotional  Stress Factors  Patient Stress Factors Major life changes;Health changes  Family Stress Factors Major life changes   Verdie Shire, chaplain resident [phone]

## 2011-10-23 NOTE — Transfer of Care (Signed)
Immediate Anesthesia Transfer of Care Note  Patient: Peter Nunez  Procedure(s) Performed: Procedure(s) (LRB): FLEXIBLE BRONCHOSCOPY (N/A) THORACOTOMY MAJOR (Right) DRAINAGE OF PLEURAL EFFUSION (Right) DECORTICATION (Right)  Patient Location: ICU  Anesthesia Type: General  Level of Consciousness: sedated and unresponsive  Airway & Oxygen Therapy: Patient remains intubated per anesthesia plan and Patient placed on Ventilator (see vital sign flow sheet for setting)  Post-op Assessment: Report given to PACU RN and Post -op Vital signs reviewed and stable  Post vital signs: Reviewed and stable  Complications: No apparent anesthesia complications

## 2011-10-23 NOTE — Consult Note (Signed)
[address]            [place] [address]          [phone]      Reason for Consult: Right empyema Referring Physician:  Rory Percy, MD  Kyrollos Cordell is an 71 y.o. male.  HPI:   The patient is a 71 year old gentleman who reports about a two-month history of cough who was treated with a course of azithromycin with improvement of his symptoms. After that course of antibiotics his cough returned. He said that he was in his usual state of health until last Saturday after  working in his garden when he developed right-sided chest pain and shortness of breath that progressed to fever and chills. He called EMS and was taken to [facility] where he was diagnosed with right lung pneumonia. A CT scan the chest was done on 10/21/2011 which showed consolidated right lung and a loculated right pleural fluid collection of moderate size , felt to be consistent with possible empyema. His respiratory status was deteriorating and he was developing renal insufficiency and was therefore transferred to [facility] for further critical care and thoracic surgery treatment.  Past Medical History  Diagnosis Date  . HTN (hypertension)   . Diabetes mellitus   . Hyperlipidemia   Stenting of RCA in 2010 in preparation for laparotomy for colon polyp resection.    No family history on file.  Social History:  reports that he quit smoking about 31 years ago. His smoking use included Cigarettes. He has a 20 pack-year smoking history. He does not have any smokeless tobacco history on file. He reports that he does not drink alcohol. His drug history not on file.  Allergies:  Allergies  Allergen Reactions  . Bee Venom     unknown    Medications:  I have reviewed the patient's current medications. Prior to Admission:  Prescriptions prior to admission  Medication Sig Dispense Refill  . amLODipine (NORVASC) 5 MG tablet Take 5 mg by mouth daily.      .  hydrochlorothiazide (HYDRODIURIL) 25 MG tablet Take 25 mg by mouth daily.      . lansoprazole (PREVACID) 30 MG capsule Take 30 mg by mouth daily.      Marland Kitchen lisinopril (PRINIVIL,ZESTRIL) 40 MG tablet Take 40 mg by mouth daily.      . metFORMIN (GLUCOPHAGE) 500 MG tablet Take 500 mg by mouth 2 (two) times daily with a meal.      . niacin (NIASPAN) 500 MG CR tablet Take 500 mg by mouth at bedtime.       Scheduled:   . antiseptic oral rinse  15 mL Mouth Rinse QID  . chlorhexidine  15 mL Mouth/Throat BID  . docusate sodium  100 mg Oral BID  . insulin aspart  0-7 Units Subcutaneous Q4H  . lactulose  20 g Oral Once  . linezolid  600 mg Intravenous Q12H  . pantoprazole (PROTONIX) IV  40 mg Intravenous QHS  . piperacillin-tazobactam  3.375 g Intravenous NOW  . piperacillin-tazobactam (ZOSYN)  IV  3.375 g Intravenous Q8H  . DISCONTD: insulin aspart  0-4 Units Subcutaneous Q4H  . DISCONTD: vancomycin  1,500 mg Intravenous Q24H   Continuous:   . sodium chloride 50 mL/hr at 10/23/11 1441  . dextrose    . DISCONTD: dextrose     BJY:NWGNFA chloride, bisacodyl, dextrose,  fentaNYL, DISCONTD: dextrose, DISCONTD: fentaNYL  Results for orders placed during the hospital encounter of 10/23/11 (from the past 48 hour(s))  MRSA PCR SCREENING     Status: Normal   Collection Time   10/23/11  2:07 PM      Component Value Range Comment   MRSA by PCR NEGATIVE  NEGATIVE    URINALYSIS, ROUTINE W REFLEX MICROSCOPIC     Status: Abnormal   Collection Time   10/23/11  2:23 PM      Component Value Range Comment   Color, Urine YELLOW  YELLOW     APPearance CLOUDY (*) CLEAR     Specific Gravity, Urine 1.024  1.005 - 1.030     pH 5.0  5.0 - 8.0     Glucose, UA NEGATIVE  NEGATIVE (mg/dL)    Hgb urine dipstick MODERATE (*) NEGATIVE     Bilirubin Urine SMALL (*) NEGATIVE     Ketones, ur NEGATIVE  NEGATIVE (mg/dL)    Protein, ur 30 (*) NEGATIVE (mg/dL)    Urobilinogen, UA 0.2  0.0 - 1.0 (mg/dL)    Nitrite NEGATIVE   NEGATIVE     Leukocytes, UA NEGATIVE  NEGATIVE    URINE MICROSCOPIC-ADD ON     Status: Abnormal   Collection Time   10/23/11  2:23 PM      Component Value Range Comment   WBC, UA 0-2  <3 (WBC/hpf)    RBC / HPF 0-2  <3 (RBC/hpf)    Bacteria, UA FEW (*) RARE     Crystals URIC ACID CRYSTALS (*) NEGATIVE    POCT I-STAT 3, BLOOD GAS (G3+)     Status: Abnormal   Collection Time   10/23/11  2:30 PM      Component Value Range Comment   pH, Arterial 7.324 (*) 7.350 - 7.450     pCO2 arterial 43.7  35.0 - 45.0 (mmHg)    pO2, Arterial 90.0  80.0 - 100.0 (mmHg)    Bicarbonate 22.7  20.0 - 24.0 (mEq/L)    TCO2 24  0 - 100 (mmol/L)    O2 Saturation 96.0      Acid-base deficit 3.0 (*) 0.0 - 2.0 (mmol/L)    Patient temperature 99.0 F      Collection site RADIAL, ALLEN'S TEST ACCEPTABLE      Drawn by Operator      Sample type ARTERIAL     CBC     Status: Abnormal   Collection Time   10/23/11  3:12 PM      Component Value Range Comment   WBC 22.1 (*) 4.0 - 10.5 (K/uL)    RBC 3.86 (*) 4.22 - 5.81 (MIL/uL)    Hemoglobin 10.9 (*) 13.0 - 17.0 (g/dL)    HCT 16.1 (*) 09.6 - 52.0 (%)    MCV 87.6  78.0 - 100.0 (fL)    MCH 28.2  26.0 - 34.0 (pg)    MCHC 32.2  30.0 - 36.0 (g/dL)    RDW 04.5  40.9 - 81.1 (%)    Platelets 372  150 - 400 (K/uL)   COMPREHENSIVE METABOLIC PANEL     Status: Abnormal   Collection Time   10/23/11  3:12 PM      Component Value Range Comment   Sodium 130 (*) 135 - 145 (mEq/L)    Potassium 4.5  3.5 - 5.1 (mEq/L)    Chloride 95 (*) 96 - 112 (mEq/L)    CO2 21  19 - 32 (mEq/L)  Glucose, Bld 294 (*) 70 - 99 (mg/dL)    BUN 75 (*) 6 - 23 (mg/dL)    Creatinine, Ser 8.65 (*) 0.50 - 1.35 (mg/dL)    Calcium 8.9  8.4 - 10.5 (mg/dL)    Total Protein 7.3  6.0 - 8.3 (g/dL)    Albumin 2.7 (*) 3.5 - 5.2 (g/dL)    AST 19  0 - 37 (U/L)    ALT 19  0 - 53 (U/L)    Alkaline Phosphatase 89  39 - 117 (U/L)    Total Bilirubin 0.3  0.3 - 1.2 (mg/dL)    GFR calc non Af Amer 33 (*) >90 (mL/min)     GFR calc Af Amer 39 (*) >90 (mL/min)   LACTIC ACID, PLASMA     Status: Normal   Collection Time   10/23/11  3:12 PM      Component Value Range Comment   Lactic Acid, Venous 1.0  0.5 - 2.2 (mmol/L)   MAGNESIUM     Status: Abnormal   Collection Time   10/23/11  3:12 PM      Component Value Range Comment   Magnesium 2.9 (*) 1.5 - 2.5 (mg/dL)   PHOSPHORUS     Status: Normal   Collection Time   10/23/11  3:12 PM      Component Value Range Comment   Phosphorus 3.9  2.3 - 4.6 (mg/dL)   PROCALCITONIN     Status: Normal   Collection Time   10/23/11  3:12 PM      Component Value Range Comment   Procalcitonin 11.10     PROTIME-INR     Status: Normal   Collection Time   10/23/11  3:12 PM      Component Value Range Comment   Prothrombin Time 14.9  11.6 - 15.2 (seconds)    INR 1.15  0.00 - 1.49    TYPE AND SCREEN     Status: Normal   Collection Time   10/23/11  3:12 PM      Component Value Range Comment   ABO/RH(D) O POS      Antibody Screen NEG      Sample Expiration 10/26/2011     GLUCOSE, CAPILLARY     Status: Abnormal   Collection Time   10/23/11  4:12 PM      Component Value Range Comment   Glucose-Capillary 268 (*) 70 - 99 (mg/dL)     US Renal  11/25/4694  *RADIOLOGY REPORT*  Clinical Data: Renal failure.  History of hypertension and diabetes.  RENAL/URINARY TRACT ULTRASOUND COMPLETE  Comparison:  None.  Findings:  Right Kidney:  There appears to be mild renal cortical thinning. The cortical echogenicity is normal.  There is no focal parenchymal abnormality, hydronephrosis or perinephric fluid collection.  The renal length is 12.1 cm.  Left Kidney:  There appears to be mild renal cortical thinning. The cortical echogenicity is normal.  There is no focal parenchymal abnormality, hydronephrosis or perinephric fluid collection.  Renal length is 11.5 cm.  Bladder:  Unremarkable for degree of distension.  Incidentally noted is increased hepatic echogenicity most consistent with steatosis. A right  pleural effusion is also noted.  IMPRESSION:  1.  Mild renal cortical thinning bilaterally. 2.  No hydronephrosis or focal parenchymal abnormality. 3.  Suspected hepatic steatosis and right pleural effusion.  Original Report Authenticated By: Gerrianne Scale, M.D.   Dg Chest Port 1 View  10/23/2011  *RADIOLOGY REPORT*  Clinical Data: Respiratory distress, shortness of  breath  PORTABLE CHEST - 1 VIEW  Comparison: None  Findings: There is volume loss of the right lung with right effusion and suspicion of right paratracheal adenopathy.  These findings are highly suspicious for endobronchial lesion i.e. lung carcinoma. Therefore, CT of the chest with IV contrast media is recommended.  The left lung is clear.  Mild cardiomegaly is present.  No bony abnormality is seen.  IMPRESSION: Volume loss on the right with right effusion and suspicion of right paratracheal adenopathy.  Possible endobronchial lesion.  Recommend CT of the chest with IV contrast media  Original Report Authenticated By: Juline Patch, M.D.   Dg Abd Portable 1v  10/23/2011  *RADIOLOGY REPORT*  Clinical Data: Abdominal pain and distention question bowel obstruction  PORTABLE ABDOMEN - 1 VIEW  Comparison: Portable exam 1511 hours without priors for comparison  Findings: Scattered contrast, air and minimal stool within colon. No definite bowel dilatation identified. No bowel wall thickening. Small bowel loops appear decompressed. Pelvis excluded. Elevation of right diaphragm. No acute osseous findings.  IMPRESSION: No definite evidence of bowel obstruction.  Original Report Authenticated By: Lollie Marrow, M.D.    Review of Systems  Constitutional: Positive for fever, chills, malaise/fatigue and diaphoresis. Negative for weight loss.  HENT: Negative.   Eyes: Negative.   Respiratory: Positive for cough, shortness of breath and wheezing. Negative for hemoptysis and sputum production.        Pain right side of chest worsened with breathing.    Cardiovascular: Negative.   Gastrointestinal: Negative.   Genitourinary: Negative.   Musculoskeletal: Negative.   Skin: Negative.   Neurological: Positive for weakness.  Endo/Heme/Allergies: Negative.   Psychiatric/Behavioral: Negative.    Blood pressure 124/63, pulse 85, temperature 100 F (37.8 C), temperature source Oral, resp. rate 17, height 6' (1.829 m), weight 114.3 kg (251 lb 15.8 oz), SpO2 100.00%. Physical Exam  Constitutional: He is oriented to person, place, and time. He appears well-developed and well-nourished. No distress.  HENT:  Head: Normocephalic and atraumatic.  Mouth/Throat: Oropharynx is clear and moist.  Eyes: Conjunctivae and EOM are normal.  Neck: Normal range of motion. Neck supple. No JVD present. No tracheal deviation present. No thyromegaly present.  Cardiovascular: Normal rate, regular rhythm, normal heart sounds and intact distal pulses.  Exam reveals no gallop and no friction rub.   No murmur heard. Respiratory:       Increased work of breathing.  Decreased breath sounds on right most of the way up.  GI: Soft. Bowel sounds are normal. He exhibits no mass. There is no tenderness.  Musculoskeletal: Normal range of motion. He exhibits no edema and no tenderness.  Lymphadenopathy:    He has no cervical adenopathy.  Neurological: He is alert and oriented to person, place, and time. He has normal strength. No cranial nerve deficit or sensory deficit. He exhibits abnormal muscle tone.  Skin: Skin is warm and dry.  Psychiatric: He has a normal mood and affect.    Assessment/Plan:  He has right lung pneumonia with development of a large right empyema. He is showing signs of toxicity with fever, leukocytosis, respiratory insufficiency, and acute renal failure. I think the best treatment is to proceed with flexible bronchoscopy to rule out any endobronchial lesions and allow clearing of the airways and culture of sputum followed by right thoracotomy with  drainage of the empyema and decortication of the right lung. I discussed the operative procedure with the patient and his daughter. We discussed alternatives, benefits, and  risks including but not limited to bleeding, blood transfusion, infection, air leak from the lung, respiratory failure requiring mechanical ventilation postoperatively, and other organ dysfunction. They understand and would like to proceed as quickly as possible. I think we should perform this tonight. The operating room has been notified.  Alleen Borne 10/23/2011, 6:17 PM

## 2011-10-23 NOTE — Progress Notes (Signed)
eLink Physician-Brief Progress Note Patient Name: Peter Nunez DOB: 1941/04/27 MRN: [id number]  Date of Service  10/23/2011   HPI/Events of Note  Pt seen on camera rounds in pain d/t empyema  eICU Interventions  Fentanyl IV dose increased   Intervention Category Intermediate Interventions: Pain - evaluation and management  Shan Levans 10/23/2011, 3:33 PM

## 2011-10-23 NOTE — Anesthesia Postprocedure Evaluation (Signed)
Anesthesia Post Note  Patient: Peter Nunez  Procedure(s) Performed: Procedure(s) (LRB): FLEXIBLE BRONCHOSCOPY (N/A) THORACOTOMY MAJOR (Right) DRAINAGE OF PLEURAL EFFUSION (Right) DECORTICATION (Right)  Anesthesia type: general  Patient location: PACU  Post pain: Pain level controlled  Post assessment: Patient's Cardiovascular Status Stable  Last Vitals:  Filed Vitals:   10/23/11 2200  BP: 125/54  Pulse: 80  Temp:   Resp: 18    Post vital signs: Reviewed and stable  Level of consciousness: sedated  Complications: No apparent anesthesia complications

## 2011-10-24 ENCOUNTER — Inpatient Hospital Stay (HOSPITAL_COMMUNITY): Payer: Medicare Other

## 2011-10-24 LAB — BLOOD GAS, ARTERIAL
Acid-base deficit: 2.5 mmol/L — ABNORMAL HIGH (ref 0.0–2.0)
Drawn by: 34779
FIO2: 0.7 %
O2 Saturation: 99.4 %
Patient temperature: 97.5
RATE: 14 resp/min
pO2, Arterial: 113 mmHg — ABNORMAL HIGH (ref 80.0–100.0)

## 2011-10-24 LAB — POCT I-STAT 3, ART BLOOD GAS (G3+)
Bicarbonate: 25.5 mEq/L — ABNORMAL HIGH (ref 20.0–24.0)
O2 Saturation: 99 %
Patient temperature: 98.4
TCO2: 27 mmol/L (ref 0–100)

## 2011-10-24 LAB — GLUCOSE, CAPILLARY
Glucose-Capillary: 190 mg/dL — ABNORMAL HIGH (ref 70–99)
Glucose-Capillary: 232 mg/dL — ABNORMAL HIGH (ref 70–99)

## 2011-10-24 LAB — BASIC METABOLIC PANEL
Calcium: 7.9 mg/dL — ABNORMAL LOW (ref 8.4–10.5)
GFR calc non Af Amer: 42 mL/min — ABNORMAL LOW (ref >90)
Sodium: 132 mEq/L — ABNORMAL LOW (ref 135–145)

## 2011-10-24 LAB — CBC
MCH: 29.2 pg (ref 26.0–34.0)
MCHC: 33.2 g/dL (ref 30.0–36.0)
Platelets: 307 10*3/uL (ref 150–400)
RDW: 13.1 % (ref 11.5–15.5)

## 2011-10-24 LAB — URINE CULTURE
Colony Count: NO GROWTH
Culture  Setup Time: 201306051500

## 2011-10-24 LAB — MAGNESIUM: Magnesium: 2.7 mg/dL — ABNORMAL HIGH (ref 1.5–2.5)

## 2011-10-24 LAB — PHOSPHORUS: Phosphorus: 4.4 mg/dL (ref 2.3–4.6)

## 2011-10-24 MED ORDER — RACEPINEPHRINE HCL 2.25 % IN NEBU
0.5000 mL | INHALATION_SOLUTION | Freq: Once | RESPIRATORY_TRACT | Status: AC
  Administered 2011-10-24: 0.5 mL via RESPIRATORY_TRACT

## 2011-10-24 MED ORDER — INSULIN GLARGINE 100 UNIT/ML ~~LOC~~ SOLN
5.0000 [IU] | Freq: Every day | SUBCUTANEOUS | Status: DC
  Administered 2011-10-24 – 2011-10-25 (×2): 5 [IU] via SUBCUTANEOUS

## 2011-10-24 MED ORDER — FENTANYL CITRATE 0.05 MG/ML IJ SOLN
50.0000 ug | INTRAMUSCULAR | Status: DC | PRN
  Administered 2011-10-24: 75 ug via INTRAVENOUS
  Administered 2011-10-24: 50 ug via INTRAVENOUS
  Administered 2011-10-25: 75 ug via INTRAVENOUS
  Administered 2011-10-26 – 2011-10-29 (×4): 50 ug via INTRAVENOUS
  Filled 2011-10-24 (×7): qty 2

## 2011-10-24 MED ORDER — MORPHINE SULFATE 2 MG/ML IJ SOLN
2.0000 mg | INTRAMUSCULAR | Status: DC | PRN
  Administered 2011-10-24: 2 mg via INTRAVENOUS
  Filled 2011-10-24: qty 1

## 2011-10-24 MED ORDER — RACEPINEPHRINE HCL 2.25 % IN NEBU
INHALATION_SOLUTION | RESPIRATORY_TRACT | Status: AC
  Filled 2011-10-24: qty 0.5

## 2011-10-24 NOTE — Progress Notes (Signed)
Name: Peter Nunez MRN: [id number] DOB: 10-04-1940    LOS: 1   PULMONARY / CRITICAL CARE MEDICINE  HPI:  71yo male with hx HTN, DM presented 6/2 to [facility] hospital with 2 week hx cough, fevers (up to 106), chills and increasing SOB.  He was admitted with PNA and treated with Rocephin/Azithro but cont to worsen and CT chest revealed large R effusion v empyema and pt was tx to [facility] for further eval.   Of note saw his PCP in "early spring" and was rx with Z-pac for pulmonary infection.    Subjective/Overnight:  VATS decortication last evening.  Remained on vent overnight.  C/o pain, denies SOB.     Vital Signs: Temp:  [97 F (36.1 C)-100 F (37.8 C)] 97 F (36.1 C) (06/06 0800) Pulse Rate:  [72-101] 74  (06/06 0800) Resp:  [13-30] 19  (06/06 0800) BP: (84-158)/(43-75) 95/50 mmHg (06/06 0800) SpO2:  [96 %-100 %] 100 % (06/06 0800) Arterial Line BP: (70-178)/(37-71) 113/49 mmHg (06/06 0800) FiO2 (%):  [50 %-100 %] 50 % (06/06 0759) Weight:  [249 lb 12.5 oz (113.3 kg)-252 lb 6.8 oz (114.5 kg)] 249 lb 12.5 oz (113.3 kg) (06/06 0439)  Physical Examination: General: chronically ill appearing male, NAD Neuro: awake, alert, MAE, follows commands  CV: s1s2 rrr PULM: resps even, non labored on vent, diminished R base but much improved, few scattered ronchi, L clear, R CT x 2  GI: abd round, mildly distended, +bs Extremities:  Warm and dry, no edema    Active Problems:  HTN (hypertension)  Diabetes mellitus  Acute respiratory failure  Empyema  Acute renal failure   ASSESSMENT AND PLAN  PULMONARY  CXR:  Improved R effusion/empyema with residual edema and atx, CT x 2  ETT: 6/5>>>6/6  A:  Acute respiratory failure - remained vented overnight post op PNA/ Pleural effusion/ empyema - s/p VATS decortication 6/5 P:   SBT now Looks good on PS 5/5 with RR 28, Vt 500-650- extubate  CXR in am  CT per CVTS  F/u VATS cultures - see ID  Pain control Aggressive pulm hygiene once  extubated   CARDIOVASCULAR  ECG:  NSR Lines:   A: SIRS - improved  P:  Check lactate, pct, cortisol  Tele   RENAL  Lab 10/24/11 0500 10/23/11 2032 10/23/11 1512  NA 132* 133* 130*  K 4.2 4.4 --  CL 98 -- 95*  CO2 22 -- 21  BUN 73* -- 75*  CREATININE 1.61* -- 1.96*  CALCIUM 7.9* -- 8.9  MG 2.7* -- 2.9*  PHOS 4.4 -- 3.9   Intake/Output      06/05 0701 - 06/06 0700 06/06 0701 - 06/07 0700   I.V. (mL/kg) 2136.8 (18.9) 37 (0.3)   Other 200    NG/GT 20 30   IV Piggyback 850 25   Total Intake(mL/kg) 3206.8 (28.3) 92 (0.8)   Urine (mL/kg/hr) 1250 (0.5) 30   Emesis/NG output 10    Blood 350    Chest Tube 156    Total Output 1766 30   Net +1440.8 +62         Foley:  6/5  A:  Acute renal failure - in setting SIRS / ATB Hyponatremia  Lab 10/24/11 0500 10/23/11 1512  CREATININE 1.61* 1.96*   P:   Cont gentle volume  F/u chem  Renal US ess neg    GASTROINTESTINAL  A:  No active issue  P:   PPI  Npo lft  HEMATOLOGIC  Lab 10/24/11 0500 10/23/11 2032 10/23/11 1512  HGB 9.3* 10.2* 10.9*  HCT 28.0* 30.0* 33.8*  PLT 307 -- 372  INR -- -- 1.15  APTT -- -- --   A:   P:  Monitor h/h post VATS SCD's for DVT proph  INFECTIOUS  Lab 10/24/11 0500 10/23/11 1512  WBC 15.8* 22.1*  PROCALCITON -- 11.10   Cultures: BCx2 6/3 ([place])>>> BC x 2 6/5>>> Urine 6/5>>> Sputum 6/5>>>  Fluid cultures (VATS) 6/5>>>  Antibiotics: Linezolid 6/5>>> Zosyn 6/5>>>  A:  PNA/empyema - s/p VATS  P:   F/u culture data  F/u culture data from [place]  Broad spectrum abx  - changed to linezolid 6/5 has been on vanc for days at [place]  Trend pct  See pulm section    ENDOCRINE  Lab 10/24/11 0342 10/23/11 2350 10/23/11 2205 10/23/11 1612  GLUCAP 294* 263* 261* 268*   A:  DM P:   ICU hyperglycemia protocol  Add lantus 6/6  NEUROLOGIC  A:  No acute issue P:   Supportive care   BEST PRACTICE / DISPOSITION Level of Care:  icu Primary Service:   pccm Consultants:  CVTS Code Status:  full Diet:  npo DVT Px:  scd GI Px:  ppi Skin Integrity:  intact Social / Family:  Pt updated   WHITEHEART,KATHRYN, NP 10/24/2011  8:57 AM Pager: [phone]  *Care during the described time interval was provided by me and/or other providers on the critical care team. I have reviewed this patient's available data, including medical history, events of note, physical examination and test results as part of my evaluation.   Attending Addendum:  I have seen the patient, discussed the issues, test results and plans with K. Whiteheart, NP. I agree with the Assessment and Plans as ammended above.   Levy Pupa, MD, PhD 10/24/2011, 10:41 AM [facility] [phone] or if no answer [phone]

## 2011-10-24 NOTE — Progress Notes (Signed)
Pt c/o "feeling like I got to pee"; foley catheter in place; attempted to irrigate but met resistance; Dr Delton Coombes notified; order received to d/c foley.  Burnard Bunting, RN

## 2011-10-24 NOTE — Progress Notes (Signed)
Inpatient Diabetes Program Recommendations  AACE/ADA: New Consensus Statement on Inpatient Glycemic Control (2009)  Target Ranges:  Prepandial:   less than 140 mg/dL      Peak postprandial:   less than 180 mg/dL (1-2 hours)      Critically ill patients:  140 - 180 mg/dL   Reason for Visit: CBGs 10/23/11 268-263 mg/dl    05/25/08  [phone] mg/dl  Inpatient Diabetes Program Recommendations Insulin - Basal: Add Lantus 15 units daily if CBGs continue greater than 180 mg/dl  Note:

## 2011-10-24 NOTE — Progress Notes (Addendum)
[address]            [place] [address]          [phone]     1 Day Post-Op Procedure(s) (LRB): FLEXIBLE BRONCHOSCOPY (N/A) THORACOTOMY MAJOR (Right) DRAINAGE OF PLEURAL EFFUSION (Right) DECORTICATION (Right)  Subjective: Stable on vent. Opens eyes and follows some commands. Still a little drowsy.  Stable night per nurse.  Objective: Vital signs in last 24 hours: Patient Vitals for the past 24 hrs:  BP Temp Temp src Pulse Resp SpO2 Height Weight  10/24/11 0759 - - - - - 99 % - -  10/24/11 0700 91/46 mmHg - - 79  15  97 % - -  10/24/11 0600 90/49 mmHg - - 73  14  100 % - -  10/24/11 0500 84/45 mmHg - - 72  14  100 % - -  10/24/11 0439 - - - - - - - 249 lb 12.5 oz (113.3 kg)  10/24/11 0400 86/46 mmHg - - 73  13  100 % - -  10/24/11 0343 - 97.5 F (36.4 C) Oral - - - - -  10/24/11 0238 - - - - - 100 % - -  10/24/11 0200 84/45 mmHg - - 74  14  100 % - -  10/24/11 0100 90/43 mmHg - - 75  14  100 % - -  10/24/11 0000 - - - 77  14  100 % - -  10/23/11 2350 - 99 F (37.2 C) Oral - - - - -  10/23/11 2300 - - - 94  18  100 % - -  10/23/11 2200 125/54 mmHg - - 80  18  100 % - 252 lb 6.8 oz (114.5 kg)  10/23/11 1800 125/54 mmHg - - 88  16  100 % - -  10/23/11 1700 124/63 mmHg - - 85  17  100 % - -  10/23/11 1645 138/64 mmHg - - 95  24  100 % - -  10/23/11 1611 150/67 mmHg - - - - - - -  10/23/11 1600 150/67 mmHg 100 F (37.8 C) Oral 92  22  97 % - -  10/23/11 1500 129/68 mmHg - - 93  20  96 % - -  10/23/11 1445 134/61 mmHg - - 101  21  97 % - -  10/23/11 1415 113/55 mmHg - - 87  19  97 % - -  10/23/11 1400 158/75 mmHg - - 91  30  97 % - -  10/23/11 1345 124/68 mmHg - - 88  18  98 % - -  10/23/11 1330 132/74 mmHg 99 F (37.2 C) Oral 90  19  97 % 6' (1.829 m) 251 lb 15.8 oz (114.3 kg)   Current Weight  10/24/11 249 lb 12.5 oz (113.3 kg)     Intake/Output from previous day: 06/05 0701 - 06/06 0700 In: 3206.8 [I.V.:2136.8; NG/GT:20;  IV Piggyback:850] Out: 1766 [Urine:1250; Emesis/NG output:10; Blood:350; Chest Tube:156]    PHYSICAL EXAM:  Heart: RRR Lungs: diminished BS on R Wound: dressed and dry Chest tube: no air leak noted   Lab Results: CBC: Basename 10/24/11 0500 10/23/11 2032 10/23/11 1512  WBC 15.8* -- 22.1*  HGB 9.3* 10.2* --  HCT 28.0* 30.0* --  PLT 307 -- 372   BMET:  Basename 10/24/11 0500 10/23/11 2032 10/23/11 1512  NA 132* 133* --  K 4.2 4.4 --  CL 98 -- 95*  CO2 22 -- 21  GLUCOSE 278* 295* --  BUN 73* -- 75*  CREATININE 1.61* -- 1.96*  CALCIUM 7.9* -- 8.9    PT/INR:  Basename 10/23/11 1512  LABPROT 14.9  INR 1.15   CXR: IMPRESSION:  1. Stable support apparatus.  2. Persistent edema and atelectasis in the right lung.    Assessment/Plan: S/P Procedure(s) (LRB): FLEXIBLE BRONCHOSCOPY (N/A) THORACOTOMY MAJOR (Right) DRAINAGE OF PLEURAL EFFUSION (Right) DECORTICATION (Right) ID- leukocytosis resolving, temp curve trending down. Continue current abx coverage for now.  Intra-op cx's pending. Pulm- waking up on vent.  Management per Pulm/CCM. Acute renal failure- Cr down.  Continue to monitor. Continue chest tubes to suction for now.   LOS: 1 day    COLLINS,GINA H 10/24/2011    Chart reviewed, patient examined, agree with above.

## 2011-10-24 NOTE — Progress Notes (Signed)
Brief Nutrition Note  Chart reviewed due to VDRF.  Patient was extubated this morning.  Patient is receiving Zyvox.  Due to possible food-nutrient interaction, patient needs a low tyramine diet while taking Zyvox.  When able to advance PO diet, recommend Low Tyramine restriction.  Hettie Holstein RD, CNSC, [place] [phone]

## 2011-10-24 NOTE — Care Management Note (Addendum)
    Page 1 of 2   10/30/2011     12:49:35 PM   CARE MANAGEMENT NOTE 10/30/2011  Patient:  Peter Nunez, Peter Nunez   Account Number:  [id number]  Date Initiated:  10/24/2011  Documentation initiated by:  Avie Arenas  Subjective/Objective Assessment:   pneumonia - same day VATS  has daughters.     Action/Plan:   PT/OT   Anticipated DC Date:  10/31/2011   Anticipated DC Plan:  HOME/SELF CARE      DC Planning Services  CM consult      Choice offered to / List presented to:          [facility] arranged  HH-3 OT  HH-2 PT      HH agency  [facility]   Status of service:  Completed, signed off Medicare Important Message given?   (If response is "NO", the following Medicare IM given date fields will be blank) Date Medicare IM given:   Date Additional Medicare IM given:    Discharge Disposition:  HOME W HOME HEALTH SERVICES  Per UR Regulation:  Reviewed for med. necessity/level of care/duration of stay  If discussed at Long Length of Stay Meetings, dates discussed:    Comments:  Contact - Daughters - Peter Nunez [phone] or Dad's cell of [phone] Peter Nunez [phone] 10-30-11 [address], [place] [phone] Pt chose [facility] for services for [facility] PT. CM made referral for services. SOC to begin within 24-48 hours post d/c.  10/28/11- 1420- Donn Pierini RN, BSN [phone] UR completed, pt still requiring O2, may end up needing home O2, will follow for potential need. PT/OT evals ordered, per PT note- recommending HH-PT- awaiting OT eval. NCM to follow up on recommendations.  10-24-11 10:45am Avie Arenas, RNBSN [phone] Talked with both daughter and patient who is just extubated in room.  Confirmed above numbers.  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  Will continue to follow for further needs.

## 2011-10-25 ENCOUNTER — Encounter (HOSPITAL_COMMUNITY): Payer: Self-pay | Admitting: Obstetrics and Gynecology

## 2011-10-25 ENCOUNTER — Inpatient Hospital Stay (HOSPITAL_COMMUNITY): Payer: Medicare Other

## 2011-10-25 LAB — COMPREHENSIVE METABOLIC PANEL
ALT: 14 U/L (ref 0–53)
Alkaline Phosphatase: 82 U/L (ref 39–117)
CO2: 22 mEq/L (ref 19–32)
Calcium: 7.9 mg/dL — ABNORMAL LOW (ref 8.4–10.5)
Chloride: 103 mEq/L (ref 96–112)
GFR calc Af Amer: 78 mL/min — ABNORMAL LOW (ref >90)
GFR calc non Af Amer: 67 mL/min — ABNORMAL LOW (ref >90)
Glucose, Bld: 234 mg/dL — ABNORMAL HIGH (ref 70–99)
Sodium: 139 mEq/L (ref 135–145)
Total Bilirubin: 0.2 mg/dL — ABNORMAL LOW (ref 0.3–1.2)

## 2011-10-25 LAB — CBC
Hemoglobin: 9.2 g/dL — ABNORMAL LOW (ref 13.0–17.0)
MCH: 28.6 pg (ref 26.0–34.0)
RBC: 3.22 MIL/uL — ABNORMAL LOW (ref 4.22–5.81)
WBC: 13.5 10*3/uL — ABNORMAL HIGH (ref 4.0–10.5)

## 2011-10-25 LAB — CULTURE, BLOOD (SINGLE)

## 2011-10-25 MED ORDER — DILTIAZEM HCL 100 MG IV SOLR
5.0000 mg/h | INTRAVENOUS | Status: AC
  Administered 2011-10-25 – 2011-10-28 (×4): 5 mg/h via INTRAVENOUS
  Filled 2011-10-25 (×3): qty 100

## 2011-10-25 MED ORDER — METOPROLOL TARTRATE 1 MG/ML IV SOLN: 5.0000 mg | Freq: Four times a day (QID) | INTRAVENOUS | Status: DC | PRN

## 2011-10-25 MED ORDER — METOPROLOL TARTRATE 1 MG/ML IV SOLN
5.0000 mg | Freq: Once | INTRAVENOUS | Status: AC
  Administered 2011-10-25: 5 mg via INTRAVENOUS
  Filled 2011-10-25: qty 5

## 2011-10-25 MED ORDER — DILTIAZEM LOAD VIA INFUSION
15.0000 mg | Freq: Once | INTRAVENOUS | Status: AC
  Administered 2011-10-25: 15 mg via INTRAVENOUS
  Filled 2011-10-25: qty 15

## 2011-10-25 MED ORDER — BIOTENE DRY MOUTH MT LIQD
15.0000 mL | Freq: Two times a day (BID) | OROMUCOSAL | Status: DC
  Administered 2011-10-26 – 2011-10-30 (×8): 15 mL via OROMUCOSAL

## 2011-10-25 NOTE — Progress Notes (Addendum)
[address]            [place] [address]          [phone]     2 Days Post-Op Procedure(s) (LRB): FLEXIBLE BRONCHOSCOPY (N/A) THORACOTOMY MAJOR (Right) DRAINAGE OF PLEURAL EFFUSION (Right) DECORTICATION (Right)  Subjective: Awake, alert, extubated.  Sore, but feels better overall.  Coughing up some bloody sputum.  Objective: Vital signs in last 24 hours: Patient Vitals for the past 24 hrs:  BP Temp Temp src Pulse Resp SpO2 Weight  10/25/11 0900 119/50 mmHg - - 93  16  95 % -  10/25/11 0800 118/51 mmHg - - 87  15  94 % -  10/25/11 0758 - 97.6 F (36.4 C) Oral - - - -  10/25/11 0700 112/50 mmHg - - 85  13  95 % -  10/25/11 0600 106/49 mmHg - - 82  12  94 % -  10/25/11 0500 111/48 mmHg - - 85  11  95 % 250 lb 7.1 oz (113.6 kg)  10/25/11 0400 116/55 mmHg - - 91  17  94 % -  10/25/11 0341 - 98.4 F (36.9 C) Oral - - - 250 lb 7.1 oz (113.6 kg)  10/25/11 0300 131/57 mmHg - - 93  23  94 % -  10/25/11 0200 118/54 mmHg - - 90  16  97 % -  10/25/11 0145 - - - - - 98 % -  10/25/11 0100 120/44 mmHg - Oral 93  34  92 % -  10/25/11 0000 114/54 mmHg 97.6 F (36.4 C) Oral 80  13  98 % -  10/24/11 2300 107/48 mmHg - - 81  13  97 % -  10/24/11 2200 115/55 mmHg - - 81  14  - -  10/24/11 2100 103/44 mmHg - - 84  14  98 % -  10/24/11 2000 111/51 mmHg 98.1 F (36.7 C) Oral 88  21  96 % -  10/24/11 1906 - - - - - 99 % -  10/24/11 1900 112/54 mmHg - - 89  23  98 % -  10/24/11 1800 124/51 mmHg - - 87  13  100 % -  10/24/11 1700 125/44 mmHg - - 93  22  94 % -  10/24/11 1600 112/56 mmHg 98.1 F (36.7 C) Oral 85  14  95 % -  10/24/11 1500 99/47 mmHg - - 85  15  95 % -  10/24/11 1400 103/43 mmHg - - 93  14  93 % -  10/24/11 1300 113/49 mmHg - - 95  29  97 % -  10/24/11 1200 98/42 mmHg - - 91  18  93 % -  10/24/11 1140 - 98.1 F (36.7 C) Oral - - - -  10/24/11 1100 131/54 mmHg - - 95  30  80 % -  10/24/11 1000 105/53 mmHg - - 87  20  93 % -  10/24/11  0932 - - - - - 91 % -   Current Weight  10/25/11 250 lb 7.1 oz (113.6 kg)     Intake/Output from previous day: 06/06 0701 - 06/07 0700 In: 3294 [I.V.:2237; NG/GT:30; IV Piggyback:1027] Out: 2010 [Urine:1680; Emesis/NG output:50; Chest Tube:280]    PHYSICAL EXAM:  Heart: RRR Lungs: diminished BS in R base Wound: dressed and dry Chest tube: no air leak  Lab Results:  CBC: Basename 10/25/11 0358 10/24/11 0500  WBC 13.5* 15.8*  HGB 9.2* 9.3*  HCT 28.5* 28.0*  PLT 336 307   BMET:  Basename 10/25/11 0358 10/24/11 0500  NA 139 132*  K 3.7 4.2  CL 103 98  CO2 22 22  GLUCOSE 234* 278*  BUN 43* 73*  CREATININE 1.10 1.61*  CALCIUM 7.9* 7.9*    PT/INR:  Basename 10/23/11 1512  LABPROT 14.9  INR 1.15   CXR: IMPRESSION:  1. Removal of ET and NG tubes.  2. Persistent areas of atelectasis but slight overall improved  aeration.  Intra-op cx's no growth so far  Assessment/Plan: S/P Procedure(s) (LRB): FLEXIBLE BRONCHOSCOPY (N/A) THORACOTOMY MAJOR (Right) DRAINAGE OF PLEURAL EFFUSION (Right) DECORTICATION (Right) ID- WBC continues to trend down, no more fevers,  Continue current abx.  Cx's negative so far. Pulm- continue IS, wean O2. ARI- Cr back to baseline. Continue CTs to suction.  Drainage decreasing.  Hopefully can start to d/c CTs this weekend. Mobilize as tolerated. Medical care per pulm/CCM.   LOS: 2 days    COLLINS,GINA H 10/25/2011  Patient seen and examined. Agree with above. OK from our standpoint to transfer out of ICU

## 2011-10-25 NOTE — Progress Notes (Signed)
eLink Physician-Brief Progress Note Patient Name: Emigdio Wildeman DOB: October 22, 1940 MRN: [id number]  Date of Service  10/25/2011   HPI/Events of Note   Pt in PAF RVR.  BP ok  eICU Interventions  Place on diltiazem drip. Check ECG and cardiac enzymes   Intervention Category Major Interventions: Arrhythmia - evaluation and management  Shan Levans 10/25/2011, 4:29 PM

## 2011-10-25 NOTE — Progress Notes (Signed)
Name: Lemonte Al MRN: [id number] DOB: Jan 29, 1941    LOS: 2   PULMONARY / CRITICAL CARE MEDICINE  HPI:  71yo male with hx HTN, DM presented 6/2 to [facility] hospital with 2 week hx cough, fevers (up to 106), chills and increasing SOB.  He was admitted with PNA and treated with Rocephin/Azithro but cont to worsen and CT chest revealed large R effusion v empyema and pt was tx to [facility] for further eval.   Of note saw his PCP in "early spring" and was rx with Z-pac for pulmonary infection.    Subjective/Overnight:  Fevers and WBC better OOB and up to chair  Vital Signs: Temp:  [97.6 F (36.4 C)-98.4 F (36.9 C)] 98.3 F (36.8 C) (06/07 1225) Pulse Rate:  [80-96] 96  (06/07 1200) Resp:  [11-34] 28  (06/07 1200) BP: (99-146)/(43-60) 146/60 mmHg (06/07 1100) SpO2:  [92 %-100 %] 96 % (06/07 1200) Arterial Line BP: (60-144)/(40-139) 92/74 mmHg (06/07 0900) Weight:  [113.6 kg (250 lb 7.1 oz)] 113.6 kg (250 lb 7.1 oz) (06/07 0500)  Physical Examination: General: chronically ill appearing male, NAD up in chair Neuro: awake, alert, MAE, follows commands  CV: s1s2 rrr PULM: resps even, non labored on vent, diminished R base but much improved, few scattered ronchi, L clear, R CT x 2  GI: abd round, mildly distended, +bs Extremities:  Warm and dry, no edema    Active Problems:  HTN (hypertension)  Diabetes mellitus  Acute respiratory failure  Empyema  Acute renal failure   ASSESSMENT AND PLAN  PULMONARY  CXR:  Improved R effusion/empyema with residual edema and atx, CT x 2  ETT: 6/5>>>6/6  A:  Acute respiratory failure - remained vented overnight post op PNA/ Pleural effusion/ empyema - s/p VATS decortication 6/5 P:   CXR in am  CT per CVTS, hopefully out over the weekend F/u VATS cultures - see ID  Pain control Aggressive pulm hygiene once extubated   CARDIOVASCULAR  ECG:  NSR Lines:   A: SIRS - improved       Hx HTN P:  Restart BP regimen when taking good  PO  RENAL  Lab 10/25/11 0358 10/24/11 0500 10/23/11 2032 10/23/11 1512  NA 139 132* 133* 130*  K 3.7 4.2 -- --  CL 103 98 -- 95*  CO2 22 22 -- 21  BUN 43* 73* -- 75*  CREATININE 1.10 1.61* -- 1.96*  CALCIUM 7.9* 7.9* -- 8.9  MG -- 2.7* -- 2.9*  PHOS -- 4.4 -- 3.9   Intake/Output      06/06 0701 - 06/07 0700 06/07 0701 - 06/08 0700   I.V. (mL/kg) 2237 (19.7) 625 (5.5)   Other     NG/GT 30    IV Piggyback 1027 25   Total Intake(mL/kg) 3294 (29) 650 (5.7)   Urine (mL/kg/hr) 1680 (0.6) 375 (0.6)   Emesis/NG output 50    Blood     Chest Tube 280    Total Output 2010 375   Net +1284 +275        Urine Occurrence 2 x 1 x   Stool Occurrence 3 x 1 x    Foley:  6/5  A:  Acute renal failure - in setting SIRS / ATB, improved Hyponatremia  Lab 10/25/11 0358 10/24/11 0500 10/23/11 1512  CREATININE 1.10 1.61* 1.96*   P:   Cont gentle volume  F/u chem    GASTROINTESTINAL  A:  No active issue  P:   PPI  Npo lft  HEMATOLOGIC  Lab 10/25/11 0358 10/24/11 0500 10/23/11 2032 10/23/11 1512  HGB 9.2* 9.3* 10.2* 10.9*  HCT 28.5* 28.0* 30.0* 33.8*  PLT 336 307 -- 372  INR -- -- -- 1.15  APTT -- -- -- --   A:   P:  Follow CBC SCD's for DVT proph  INFECTIOUS  Lab 10/25/11 0358 10/24/11 0500 10/23/11 1512  WBC 13.5* 15.8* 22.1*  PROCALCITON -- -- 11.10   Cultures: BCx2 6/3 ([place])>>> BC x 2 6/5>>> Urine 6/5>>> negative Sputum 6/5>>>  Fluid cultures (VATS) 6/5>>>  Antibiotics: Linezolid 6/5>>> Zosyn 6/5>>>  A:  PNA/empyema - s/p VATS  P:   F/u culture data  F/u culture data from [place]  Broad spectrum abx  - changed to linezolid 6/5 has been on vanc for days at [place]  See pulm section    ENDOCRINE  Lab 10/25/11 0756 10/25/11 0347 10/24/11 2327 10/24/11 1949 10/24/11 1547  GLUCAP 164* 211* 192* 232* 190*   A:  DM P:   ICU hyperglycemia protocol  Added lantus 6/6  NEUROLOGIC  A:  No acute issue P:   Supportive care   BEST PRACTICE /  DISPOSITION Level of Care:  icu >> to SDU on 6/7 Primary Service:  pccm Consultants:  CVTS Code Status:  full Diet:  Clears advancing as tolerated DVT Px:  scd GI Px:  ppi Skin Integrity:  intact Social / Family:  Pt updated   Levy Pupa, MD, PhD 10/25/2011, 12:42 PM [facility] [phone] or if no answer [phone]

## 2011-10-25 NOTE — Progress Notes (Signed)
PCCM Interval Note  New A fib noted on monitor, confirmed on ECG. No hx of A Fib per records and patient. hemodynamically stable. Suspect this is due to post-op stress, should not persist or require long term meds or anticoag. Will rate control now and follow. If persists consider amio bolus.  Levy Pupa, MD, PhD 10/25/2011, 4:27 PM [place] [facility] [phone] or if no answer [phone]

## 2011-10-25 NOTE — Op Note (Signed)
Peter Nunez, Peter Nunez NO.:  [id number]  MEDICAL RECORD NO.:  [id number]  LOCATION:  2109                         FACILITY:  [facility]  PHYSICIAN:  Evelene Croon, M.D.     DATE OF BIRTH:  06/28/1940  DATE OF PROCEDURE:  10/23/2011 DATE OF DISCHARGE:                              OPERATIVE REPORT   PREOPERATIVE DIAGNOSIS:  Right empyema.  POSTOPERATIVE DIAGNOSIS:  Right empyema.  OPERATIVE PROCEDURES: 1. Flexible fiberoptic bronchoscopy. 2. Right muscle-sparing thoracotomy with drainage of right empyema and     decortication of the right lung.  ATTENDING SURGEON:  Evelene Croon, MD  ANESTHESIA:  General endotracheal.  CLINICAL HISTORY:  This patient is a 71 year old gentleman who reports about a 50-month history of cough and was treated course of the azithromycin with improvement in the symptoms.  After that course of antibiotics, his cough returned.  Last Saturday, he was working in his garden and later in the day, developed right-sided chest pain and shortness of breath that progressed with development of fever and chills.  He called EMS and was taken to [facility] where he was diagnosed with a right lung pneumonia.  CT scan of the chest was done on October 21, 2011, which showed consolidated right lung with a loculated right pleural fluid collection of moderate size felt to be consistent with possible empyema.  His respiratory status was deteriorating and he was developing acute renal insufficiency and was therefore transferred to [facility] for further critical care and thoracic surgery management.  When I saw the patient, he was having some respiratory distress and rising creatinine with oliguria and I felt it would be best to proceed ahead with drainage of the empyema.  I discussed the operative procedure with the patient and his daughter including alternatives, benefits, and risks including, but not limited to bleeding, blood transfusion,  infection, injury to lung, prolonged air leak, respiratory failure, organ dysfunction, and death.  He understood and agreed to proceed.  OPERATIVE PROCEDURE:  The patient was taken to the operative room and placed on the table in supine position.  I had already signed the right side of the chest.  After an induction of general endotracheal anesthesia using a single-lumen tube, a Foley catheter was placed in the bladder using a sterile technique.  Lower extremity pneumatic compression devices were placed.  Then, flexible fiberoptic bronchoscopy was performed.  The distal trachea was normal.  The carina was sharp. The right and left bronchial tree had normal segmental anatomy.  I was able to pass the bronchoscope out into the subsegmental bronchi and there were no lesions seen.  There was no sign of extrinsic compression. There was mild edema of the right bronchial tree.  There were minimal secretions present.  I did suction some of the secretions and sent them for culture.  The bronchoscope was then withdrawn from the patient. Then, the single-lumen tube was connected to a double-lumen tube.  The patient was turned in the left lateral decubitus position with the right side up.  The right side of the chest was prepped with Betadine soap and solution and draped in usual sterile manner.  Time-out  was taken, and proper patient, proper operation, proper operative side were confirmed with nursing and anesthesia staff after reviewing the CT scan in the operating room.  Then, the right chest was entered through a muscle- sparing lateral thoracotomy incision.  The fifth intercostal space was opened and the pleural space entered.  There was immediate withdrawal of about 1000 mL of murky serous fluid.  The chest retractor was placed. Examination of the pleural space showed a complex loculated empyema with thick fibrinous peel completely covering the lung and the parietal pleural surfaces and  diaphragm.  The patient did not tolerate one lung anesthesia very well and after a few minutes, would desaturate down into the low 90s and we have to ventilate both lungs to get him back up to 100%.  We did this repeatedly.  The lowest saturation got, was down to about 85% and that was transiently.  After complete drainage of the empyema, the right lung was decorticated by peeling this fibrinous layer off of the lung.  It was also peeled off of the pleural space and diaphragm.  After complete decortication, two 32-French Blake drains were brought through separate stab incisions and one positioned inferiorly and one positioned posteriorly towards the apex.  The chest was irrigated with warm saline solution.  Then, the ribs were reapproximated with #2 Vicryl pericostal sutures.  The muscles were returned to the normal anatomic position.  The subcutaneous tissue was closed loosely with continuous 2-0 Vicryl and the skin with staples. The sponge, needle and instrument counts were correct according to the scrub nurse.  Dry sterile dressings were applied over the incision and around the chest tubes, which were hooked to Pleur-Evac suction.  The patient tolerated the procedure well and was then turned in the supine position.  The double-lumen tube was then changed to had a single-lumen tube, and he was transported to the medical intensive care unit in guarded, but stable condition.     Evelene Croon, M.D.     BB/MEDQ  D:  10/24/2011  T:  10/25/2011  Job:  [id number]

## 2011-10-26 ENCOUNTER — Inpatient Hospital Stay (HOSPITAL_COMMUNITY): Payer: Medicare Other

## 2011-10-26 LAB — TISSUE CULTURE: Culture: NO GROWTH

## 2011-10-26 LAB — BASIC METABOLIC PANEL
BUN: 22 mg/dL (ref 6–23)
Chloride: 102 mEq/L (ref 96–112)
GFR calc Af Amer: >90 mL/min (ref >90)
Glucose, Bld: 213 mg/dL — ABNORMAL HIGH (ref 70–99)
Potassium: 3.7 mEq/L (ref 3.5–5.1)

## 2011-10-26 LAB — CBC
HCT: 28.8 % — ABNORMAL LOW (ref 39.0–52.0)
Hemoglobin: 9.1 g/dL — ABNORMAL LOW (ref 13.0–17.0)
MCH: 28.1 pg (ref 26.0–34.0)
MCHC: 31.6 g/dL (ref 30.0–36.0)
RBC: 3.24 MIL/uL — ABNORMAL LOW (ref 4.22–5.81)

## 2011-10-26 LAB — GLUCOSE, CAPILLARY
Glucose-Capillary: 147 mg/dL — ABNORMAL HIGH (ref 70–99)
Glucose-Capillary: 190 mg/dL — ABNORMAL HIGH (ref 70–99)
Glucose-Capillary: 199 mg/dL — ABNORMAL HIGH (ref 70–99)

## 2011-10-26 LAB — CULTURE, RESPIRATORY W GRAM STAIN: Culture: NO GROWTH

## 2011-10-26 MED ORDER — METOPROLOL TARTRATE 12.5 MG HALF TABLET
12.5000 mg | ORAL_TABLET | Freq: Two times a day (BID) | ORAL | Status: DC
  Administered 2011-10-26 – 2011-10-30 (×9): 12.5 mg via ORAL
  Filled 2011-10-26 (×11): qty 1

## 2011-10-26 MED ORDER — INSULIN GLARGINE 100 UNIT/ML ~~LOC~~ SOLN
10.0000 [IU] | Freq: Every day | SUBCUTANEOUS | Status: DC
  Administered 2011-10-26 – 2011-10-29 (×4): 10 [IU] via SUBCUTANEOUS

## 2011-10-26 NOTE — Progress Notes (Signed)
ANTIBIOTIC CONSULT NOTE - follow up  Pharmacy Consult for  Zosyn, Zyvox Indication: pneumonia and R effusion vs. empyema  Allergies  Allergen Reactions  . Bee Venom     unknown    Patient Measurements: Height: 6' (182.9 cm) Weight: 250 lb (113.4 kg) IBW/kg (Calculated) : 77.6   Vital Signs: Temp: 97.5 F (36.4 C) (06/08 0800) Temp src: Oral (06/08 0800) BP: 153/72 mmHg (06/08 0800) Pulse Rate: 88  (06/08 0800)  Medical History: Past Medical History  Diagnosis Date  . HTN (hypertension)   . Diabetes mellitus   . Hyperlipidemia    Assessment: Pt transferred from [facility] with 3-d hx of cough, fever, chills and SOB. He was being treated with rocephin/azithro for PNA, but continued to worsen. Chest CT revealed R effusion vs. Empyema- patient was transferred here for further eval.  Noted Vanc and Zosyn initiated at OSH- Vanc 1250mg  q 24h (given 1945-6/4), Zosyn 3.375gm ext infusion dosing (given 0630-6/5). Received orders to continue broad spectrum abx.  Patient seems to be tolerating Linezolid.  H/H 9.1/28.8, platelets 323.   Goal of Therapy:  Treat infection  Plan:  1. Zosyn 3.375gm IV Q8h, each dose infused over 4 hours. 2. Continue Linezolid 600mg  IV q12.  May be able to change to PO soon.  Has only been on clear liquids, and diet is advanced today.   Mickeal Skinner 10/26/2011,11:00 AM

## 2011-10-26 NOTE — Progress Notes (Signed)
Name: Peter Nunez MRN: [id number] DOB: 05-14-1941    LOS: 3   PULMONARY / CRITICAL CARE MEDICINE  HPI:  71yo male with hx HTN, DM presented 6/2 to [facility] hospital with 2 week hx cough, fevers (up to 106), chills and increasing SOB.  He was admitted with PNA and treated with Rocephin/Azithro but cont to worsen and CT chest revealed large R effusion v empyema and pt was tx to [facility] for further eval.   Of note saw his PCP in "early spring" and was rx with Z-pac for pulmonary infection. Transferred 6/5 to [facility] and taken urgently to OR for VATS decortication by Dr. Laneta Simmers.      Subjective/Overnight:  Afib RVR overnight   Vital Signs: Temp:  [97.4 F (36.3 C)-98.3 F (36.8 C)] 97.5 F (36.4 C) (06/08 0800) Pulse Rate:  [73-131] 88  (06/08 0800) Resp:  [5-28] 25  (06/08 0800) BP: (94-153)/(50-72) 153/72 mmHg (06/08 0800) SpO2:  [91 %-97 %] 91 % (06/08 0800) Weight:  [250 lb (113.4 kg)] 250 lb (113.4 kg) (06/08 0500)  Physical Examination: General: chronically ill appearing male, NAD up in chair Neuro: awake, alert, MAE, follows commands  CV: s1s2 rrr PULM: resps even, non labored on vent, diminished R base but much improved, few scattered ronchi, L clear, R CT x 2  GI: abd round, mildly distended, +bs Extremities:  Warm and dry, no edema   Active Problems:  HTN (hypertension)  Diabetes mellitus  Acute respiratory failure  Empyema  Acute renal failure  ASSESSMENT AND PLAN  PULMONARY  CXR:  Improved R effusion/empyema with residual edema and atx, CT x 2  ETT: 6/5>>>6/6  A:  Acute respiratory failure - remained vented overnight post op.  S/p extubation 6/6 POD#1 PNA/ Pleural effusion/ empyema - s/p VATS decortication 6/5 P:   CXR in am  CT per CVTS, hopefully out in next day or so F/u VATS cultures - see ID  Pain control Aggressive pulm hygiene   CARDIOVASCULAR  ECG:  NSR Lines:  R IJ CVL (OR) 6/5>>>  A: SIRS - resolved.       Hx HTN      Afib RVR - Placed on  cardizem gtt overnight.  Now NSR 80's. P:  Trend cardiac enzymes  Restart BP regimen when taking good PO  RENAL  Lab 10/26/11 0431 10/25/11 0358 10/24/11 0500 10/23/11 2032 10/23/11 1512  NA 138 139 132* 133* 130*  K 3.7 3.7 -- -- --  CL 102 103 98 -- 95*  CO2 25 22 22  -- 21  BUN 22 43* 73* -- 75*  CREATININE 0.94 1.10 1.61* -- 1.96*  CALCIUM 8.0* 7.9* 7.9* -- 8.9  MG -- -- 2.7* -- 2.9*  PHOS -- -- 4.4 -- 3.9   Intake/Output      06/07 0701 - 06/08 0700 06/08 0701 - 06/09 0700   P.O. 813 360   I.V. (mL/kg) 2945 (26) 130 (1.1)   NG/GT 200    IV Piggyback 772.5 12.5   Total Intake(mL/kg) 4730.5 (41.7) 502.5 (4.4)   Urine (mL/kg/hr) 1725 (0.6) 200   Emesis/NG output     Chest Tube 50    Total Output 1775 200   Net +2955.5 +302.5        Urine Occurrence 2 x    Stool Occurrence 2 x     Foley:  6/5  A:  Acute renal failure - in setting SIRS / ATB, resolved.  Hyponatremia  Lab 10/26/11 0431 10/25/11 0358 10/24/11  0500 10/23/11 1512  CREATININE 0.94 1.10 1.61* 1.96*   P:   Cont gentle volume - decrease to 75ml now taking PO F/u chem    GASTROINTESTINAL  A:  No active issue  P:   PPI  Diet   HEMATOLOGIC  Lab 10/26/11 0431 10/25/11 0358 10/24/11 0500 10/23/11 2032 10/23/11 1512  HGB 9.1* 9.2* 9.3* 10.2* 10.9*  HCT 28.8* 28.5* 28.0* 30.0* 33.8*  PLT 323 336 307 -- 372  INR -- -- -- -- 1.15  APTT -- -- -- -- --   A:   P:  Follow CBC SCD's for DVT proph  INFECTIOUS  Lab 10/26/11 0431 10/25/11 0358 10/24/11 0500 10/23/11 1512  WBC 13.1* 13.5* 15.8* 22.1*  PROCALCITON 1.79 -- -- 11.10   Cultures: BCx2 6/3 ([place])>>> BC x 2 6/5>>> Urine 6/5>>> negative Sputum 6/5>>>  Fluid cultures (VATS) 6/5>>> Tissue (R lung - VATS) 6/5>>> NEG   Antibiotics: Linezolid 6/5>>> Zosyn 6/5>>>  A:  PNA/empyema - s/p VATS  P:   F/u culture data  F/u culture data from [place]  Broad spectrum abx  - changed to linezolid 6/5 was previously on vanc for days at  [place]  If cultures remain neg consider narrow abx in next 24-48 hours  See pulm section    ENDOCRINE  Lab 10/26/11 0410 10/26/11 0002 10/25/11 1533 10/25/11 1214 10/25/11 0756  GLUCAP 194* 147* 210* 167* 164*   A:  DM P:   ICU hyperglycemia protocol  Increase lantus 6/8  NEUROLOGIC  A:  No acute issue P:   Supportive care  Pt/ot   BEST PRACTICE / DISPOSITION Level of Care:  SD Primary Service:  pccm Consultants:  CVTS Code Status:  full Diet:  Carb mod  DVT Px:  scd GI Px:  ppi Skin Integrity:  intact Social / Family:  Pt and daughter updated 6/8   [facility], NP 10/26/2011  10:01 AM Pager: [phone]  *Care during the described time interval was provided by me and/or other providers on the critical care team. I have reviewed this patient's available data, including medical history, events of note, physical examination and test results as part of my evaluation.  Will d/c cardiazem drip and start PO lopressor for BP and HR control.  Likely to transfer to SDU in AM if off cardiazem drip.  Patient seen and examined, agree with above note.  I dictated the care and orders written for this patient under my direction.  Koren Bound, M.D. [phone]

## 2011-10-26 NOTE — Progress Notes (Addendum)
3 Days Post-Op Procedure(s) (LRB): FLEXIBLE BRONCHOSCOPY (N/A) THORACOTOMY MAJOR (Right) DRAINAGE OF PLEURAL EFFUSION (Right) DECORTICATION (Right)  Subjective: Patient continues to feel better.  Objective: Vital signs in last 24 hours: Patient Vitals for the past 24 hrs:  BP Temp Temp src Pulse Resp SpO2 Weight  10/26/11 0800 153/72 mmHg 97.5 F (36.4 C) Oral 88  25  91 % -  10/26/11 0700 119/56 mmHg - - 76  11  92 % -  10/26/11 0600 150/59 mmHg - - 92  28  91 % -  10/26/11 0500 132/58 mmHg - - 81  16  93 % 250 lb (113.4 kg)  10/26/11 0410 - 97.4 F (36.3 C) Oral - - - -  10/26/11 0400 117/53 mmHg - - 75  12  96 % -  10/26/11 0300 123/53 mmHg - - 78  5  94 % -  10/26/11 0200 109/50 mmHg - - 73  11  95 % -  10/26/11 0100 115/51 mmHg - - 74  13  96 % -  10/26/11 0015 134/66 mmHg - - 79  12  96 % -  10/26/11 0002 - 97.5 F (36.4 C) Oral - - - -  10/26/11 0000 112/52 mmHg - - 73  12  96 % -  10/25/11 2300 130/58 mmHg - - 80  24  95 % -  10/25/11 2200 113/50 mmHg - - 79  12  93 % -  10/25/11 2100 110/57 mmHg - - 99  13  92 % -  10/25/11 2046 - - - - - 94 % -  10/25/11 2000 128/71 mmHg 98.2 F (36.8 C) Oral 87  24  92 % -  10/25/11 1900 125/58 mmHg - - 91  26  92 % -  10/25/11 1800 94/55 mmHg - - 88  19  95 % -  10/25/11 1700 111/65 mmHg - - 113  21  92 % -  10/25/11 1600 124/62 mmHg 97.7 F (36.5 C) Oral 131  17  94 % -  10/25/11 1545 - - - - - 97 % -  10/25/11 1500 119/51 mmHg - - 127  14  96 % -  10/25/11 1400 124/57 mmHg - - 88  18  95 % -  10/25/11 1300 141/72 mmHg - - 84  14  96 % -  10/25/11 1225 - 98.3 F (36.8 C) Oral - - - -  10/25/11 1200 - - - 96  28  96 % -  10/25/11 1100 146/60 mmHg - - 94  25  96 % -  10/25/11 1000 - - - 94  24  94 % -    Current Weight  10/26/11 250 lb (113.4 kg)      Intake/Output from previous day: 06/07 0701 - 06/08 0700 In: 4730.5 [P.O.:813; I.V.:2945; NG/GT:200; IV Piggyback:772.5] Out: 1775 [Urine:1725; Chest  Tube:50]   Physical Exam:  Cardiovascular: RRR, no murmurs, gallops, or rubs. Pulmonary: Clear to auscultation on left; diminished breath sound at right base;no rales, wheezes, or rhonchi. Abdomen: Soft, non tender, bowel sounds present. Extremities: Mild bilateral lower extremity edema. Wounds: Clean and dry.  No erythema or signs of infection.  Lab Results: CBC: Basename 10/26/11 0431 10/25/11 0358  WBC 13.1* 13.5*  HGB 9.1* 9.2*  HCT 28.8* 28.5*  PLT 323 336   BMET:  Basename 10/26/11 0431 10/25/11 0358  NA 138 139  K 3.7 3.7  CL 102 103  CO2 25 22  GLUCOSE 213* 234*  BUN 22 43*  CREATININE 0.94 1.10  CALCIUM 8.0* 7.9*    PT/INR:  Basename 10/23/11 1512  LABPROT 14.9  INR 1.15   ABG:  INR: Will add last result for INR, ABG once components are confirmed Will add last 4 CBG results once components are confirmed  Assessment/Plan:  1.Pulmonary-chest tube with 50 cc of output last 24 hours. Chest tube is to suction.There is no air leak.Possibly place to water seal.CXR this am shows patient rotated to the right, right basilar airspace opacities and pleural effusion. Final tissue culture showed no organisms or growth.WBC remains 13,100.Continue Zosyn and Zyvox.Wean O2 as tolerates. 2.Hopefully, transfer to regular floor today. 3.Management per CCM.  ZIMMERMAN,DONIELLE MPA-C 10/26/2011  Patient seen and examined, agree with above No air leak- water seal Will keep both tubes today, can probably d/c one tomorrow

## 2011-10-27 ENCOUNTER — Inpatient Hospital Stay (HOSPITAL_COMMUNITY): Payer: Medicare Other

## 2011-10-27 DIAGNOSIS — I4891 Unspecified atrial fibrillation: Secondary | ICD-10-CM

## 2011-10-27 LAB — GLUCOSE, CAPILLARY: Glucose-Capillary: 244 mg/dL — ABNORMAL HIGH (ref 70–99)

## 2011-10-27 LAB — BODY FLUID CULTURE

## 2011-10-27 NOTE — Progress Notes (Addendum)
4 Days Post-Op Procedure(s) (LRB): FLEXIBLE BRONCHOSCOPY (N/A) THORACOTOMY MAJOR (Right) DRAINAGE OF PLEURAL EFFUSION (Right) DECORTICATION (Right)  Subjective: Patient continues to feel better.  Objective: Vital signs in last 24 hours: Patient Vitals for the past 24 hrs:  BP Temp Temp src Pulse Resp SpO2 Height Weight  10/27/11 0800 143/72 mmHg - - 81  16  94 % - -  10/27/11 0725 - 97.6 F (36.4 C) Oral - - - - -  10/27/11 0700 137/65 mmHg - - 74  13  93 % - -  10/27/11 0600 158/75 mmHg - - 91  27  93 % - -  10/27/11 0556 - - - 87  22  91 % - -  10/27/11 0500 130/68 mmHg - - 76  14  94 % - -  10/27/11 0436 - - - - - - - 259 lb 11.2 oz (117.799 kg)  10/27/11 0400 139/67 mmHg - - 81  15  93 % - -  10/27/11 0300 139/67 mmHg 98.2 F (36.8 C) Oral 84  22  92 % - -  10/27/11 0200 115/62 mmHg - - 74  10  93 % - -  10/27/11 0000 - - - 73  13  95 % - -  10/26/11 2300 121/59 mmHg 97.5 F (36.4 C) Oral 80  25  92 % - -  10/26/11 2200 141/69 mmHg - - 86  23  90 % - -  10/26/11 2145 160/75 mmHg - - 89  - - - -  10/26/11 2050 - - - 105  26  91 % - -  10/26/11 2031 162/77 mmHg 99.1 F (37.3 C) Oral 98  27  91 % 6' (1.829 m) 254 lb 6.6 oz (115.4 kg)  10/26/11 2000 154/74 mmHg - - 93  27  94 % - -  10/26/11 1900 177/66 mmHg - - 99  27  91 % - -  10/26/11 1800 177/81 mmHg - - 101  30  94 % - -  10/26/11 1700 162/64 mmHg - - 99  35  93 % - -  10/26/11 1600 159/65 mmHg 97.8 F (36.6 C) Oral 103  25  92 % - -  10/26/11 1500 152/58 mmHg - - 92  26  94 % - -  10/26/11 1400 170/70 mmHg - - 105  35  87 % - -  10/26/11 1300 160/57 mmHg - - 97  32  93 % - -  10/26/11 1200 139/56 mmHg - - 80  15  95 % - -  10/26/11 1100 135/54 mmHg - - 81  26  95 % - -  10/26/11 1000 133/52 mmHg - - 79  15  96 % - -    Current Weight  10/27/11 259 lb 11.2 oz (117.799 kg)      Intake/Output from previous day: 06/08 0701 - 06/09 0700 In: 4215.4 [P.O.:540; I.V.:2962.9; IV Piggyback:712.5] Out: 2377  [Urine:2375; Stool:2]   Physical Exam:  Cardiovascular: RRR, no murmurs, gallops, or rubs. Pulmonary: Clear to auscultation on left; diminished breath sound at right base and scattered rhonchi. Abdomen: Soft, non tender, bowel sounds present. Extremities: Mild bilateral lower extremity edema. Wounds: Clean and dry.  No erythema or signs of infection.  Lab Results: CBC:  Basename 10/26/11 0431 10/25/11 0358  WBC 13.1* 13.5*  HGB 9.1* 9.2*  HCT 28.8* 28.5*  PLT 323 336   BMET:   Basename 10/26/11 0431 10/25/11 0358  NA 138 139  K  3.7 3.7  CL 102 103  CO2 25 22  GLUCOSE 213* 234*  BUN 22 43*  CREATININE 0.94 1.10  CALCIUM 8.0* 7.9*    PT/INR: No results found for this basename: LABPROT,INR in the last 72 hours ABG:  INR: Will add last result for INR, ABG once components are confirmed Will add last 4 CBG results once components are confirmed  Assessment/Plan:  1.Pulmonary-chest tube with scant drainage last 24 hours. Chest tube is to waterseal.There is no air leak.Possibly remove 1 chest tube today.CXR this am shows no pneumothorax and some improvement in aeration.Final tissue culture showed no organisms or growth.WBC remains 13,100.Continue Zosyn and Zyvox.Wean O2 as tolerates (on 5 liters). 2.Management per CCM.  ZIMMERMAN,DONIELLE MPA-C 10/27/2011  Patient seen and examined, agree with above, d/c posterior CT

## 2011-10-27 NOTE — Progress Notes (Signed)
PT Cancellation Note  Treatment cancelled today due to patient's refusal to participate. Pt stated that he recently got up with RN and is exhausted. States that he will work with therapy tomorrow. Will attempt evaluation tomorrow pending pt willingness.  Thanks, 10/27/2011 Milana Kidney DPT PAGER: [phone] OFFICE: [phone]    Milana Kidney 10/27/2011, 2:58 PM

## 2011-10-27 NOTE — Progress Notes (Signed)
Posterior CT d/c per MD order, pt tol well, pt VSS, pt educated to call is SOB, Chest tightness, or feels any different from current state

## 2011-10-27 NOTE — Progress Notes (Signed)
Name: Peter Nunez MRN: [id number] DOB: [birth year]    LOS: 4   PULMONARY / CRITICAL CARE MEDICINE  HPI:  71yo male with hx HTN, DM presented 6/2 to [facility] hospital with 2 week hx cough, fevers (up to 106), chills and increasing SOB.  He was admitted with PNA and treated with Rocephin/Azithro but cont to worsen and CT chest revealed large R effusion v empyema and pt was tx to [facility] for further eval.   Of note saw his PCP in "early spring" and was rx with Z-pac for pulmonary infection. Transferred 6/5 to [facility] and taken urgently to OR for VATS decortication by Dr. Laneta Simmers.      Subjective/Overnight:  "Best night sleep- I'm getting wonderful care here". Feels breathing more easily. No acute concern reported.  Vital Signs: Temp:  [97.5 F (36.4 C)-99.1 F (37.3 C)] 97.6 F (36.4 C) (06/09 0725) Pulse Rate:  [73-105] 87  (06/09 0556) Resp:  [10-35] 22  (06/09 0556) BP: (115-177)/(52-81) 139/67 mmHg (06/09 0400) SpO2:  [87 %-96 %] 91 % (06/09 0556) Weight:  [115.4 kg (254 lb 6.6 oz)-117.799 kg (259 lb 11.2 oz)] 117.799 kg (259 lb 11.2 oz) (06/09 0436)  Physical Examination: General:  NAD in bed  Neuro: awake, alert, MAE, follows commands  CV: s1s2 rrr PULM: resps even, non labored , diminished R base but much improved, few scattered ronchi, L clear, R CT x 2 w/ yellow serous drainage  GI: abd round, mildly distended, +bs Extremities:  Warm and dry, no edema.SCDs in place  Active Problems:  HTN (hypertension)  Diabetes mellitus  Acute respiratory failure  Empyema  Acute renal failure Atrial fib  ASSESSMENT AND PLAN  PULMONARY  CXR:  Improved R effusion/empyema with residual edema and atx, CT x 2  ETT: 6/5>>>6/6  A:  Acute respiratory failure - remained vented overnight post op.  S/p extubation 6/6 POD#1 PNA/ Pleural effusion/ empyema - s/p VATS decortication 6/5 P:   CXR in am  CT per CVTS, hopefully out in next day or so F/u VATS cultures - see ID  Pain control Aggressive  pulm hygiene   CARDIOVASCULAR  ECG:  NSR Lines:  R IJ CVL (OR) 6/5>>>  A: SIRS - resolved.       Hx HTN      Afib RVR - Placed on cardizem gtt overnight, continued.  Now NSR 80's. P:  Trend cardiac enzymes  Restart BP regimen when taking good PO  RENAL  Lab 10/26/11 0431 10/25/11 0358 10/24/11 0500 10/23/11 2032 10/23/11 1512  NA 138 139 132* 133* 130*  K 3.7 3.7 -- -- --  CL 102 103 98 -- 95*  CO2 25 22 22  -- 21  BUN 22 43* 73* -- 75*  CREATININE 0.94 1.10 1.61* -- 1.96*  CALCIUM 8.0* 7.9* 7.9* -- 8.9  MG -- -- 2.7* -- 2.9*  PHOS -- -- 4.4 -- 3.9   Intake/Output      06/08 0701 - 06/09 0700 06/09 0701 - 06/10 0700   P.O. 540    I.V. (mL/kg) 2962.9 (25.2)    NG/GT     IV Piggyback 712.5    Total Intake(mL/kg) 4215.4 (35.8)    Urine (mL/kg/hr) 2375 (0.8)    Stool 2    Chest Tube 0    Total Output 2377    Net +1838.4          Foley:  6/5  A:  Acute renal failure - in setting SIRS / ATB, resolved.  Hyponatremia  Lab 10/26/11 0431 10/25/11 0358 10/24/11 0500 10/23/11 1512  CREATININE 0.94 1.10 1.61* 1.96*   P:   Cont gentle volume - decrease to 75ml now taking PO F/u chem    GASTROINTESTINAL  A:  No active issue  P:   PPI  Diet   HEMATOLOGIC  Lab 10/26/11 0431 10/25/11 0358 10/24/11 0500 10/23/11 2032 10/23/11 1512  HGB 9.1* 9.2* 9.3* 10.2* 10.9*  HCT 28.8* 28.5* 28.0* 30.0* 33.8*  PLT 323 336 307 -- 372  INR -- -- -- -- 1.15  APTT -- -- -- -- --   A:   P:  Follow CBC SCD's for DVT proph  INFECTIOUS  Lab 10/26/11 0431 10/25/11 0358 10/24/11 0500 10/23/11 1512  WBC 13.1* 13.5* 15.8* 22.1*  PROCALCITON 1.79 -- -- 11.10   Cultures: BCx2 6/3 ([place])>>> BC x 2 6/5>>> Urine 6/5>>> negative Sputum 6/5>>>  Fluid cultures (VATS) 6/5>>> Tissue (R lung - VATS) 6/5>>> NEG   Antibiotics: Linezolid 6/5>>> Zosyn 6/5>>>  A:  PNA/empyema - s/p VATS  P:   F/u culture data  F/u culture data from [place]  Broad spectrum abx  - changed to  linezolid 6/5 was previously on vanc for days at [place]  If cultures remain neg consider narrow abx in next 24-48 hours  See pulm section    ENDOCRINE  Lab 10/27/11 0401 10/26/11 2319 10/26/11 1951 10/26/11 1602 10/26/11 1151  GLUCAP 165* 155* 199* 224* 190*   A:  DM P:   ICU hyperglycemia protocol  Increase lantus 6/8  NEUROLOGIC  A:  No acute issue P:   Supportive care  Pt/ot   BEST PRACTICE / DISPOSITION Level of Care:  SD Primary Service:  pccm Consultants:  CVTS Code Status:  full Diet:  Carb mod  DVT Px:  scd GI Px:  ppi Skin Integrity:  intact Social / Family:  Pt and daughter updated 6/8   Waymon Budge, MD 10/27/2011  8:03 AM

## 2011-10-28 ENCOUNTER — Inpatient Hospital Stay (HOSPITAL_COMMUNITY): Payer: Medicare Other

## 2011-10-28 LAB — GLUCOSE, CAPILLARY
Glucose-Capillary: 107 mg/dL — ABNORMAL HIGH (ref 70–99)
Glucose-Capillary: 169 mg/dL — ABNORMAL HIGH (ref 70–99)
Glucose-Capillary: 194 mg/dL — ABNORMAL HIGH (ref 70–99)

## 2011-10-28 MED ORDER — PANTOPRAZOLE SODIUM 40 MG PO TBEC
40.0000 mg | DELAYED_RELEASE_TABLET | Freq: Every day | ORAL | Status: DC
  Administered 2011-10-28 – 2011-10-30 (×3): 40 mg via ORAL
  Filled 2011-10-28 (×2): qty 1

## 2011-10-28 MED ORDER — INSULIN ASPART 100 UNIT/ML ~~LOC~~ SOLN
0.0000 [IU] | Freq: Three times a day (TID) | SUBCUTANEOUS | Status: DC
  Administered 2011-10-28 (×2): 4 [IU] via SUBCUTANEOUS
  Administered 2011-10-28 – 2011-10-29 (×2): 8 [IU] via SUBCUTANEOUS
  Administered 2011-10-29: 16:00:00 via SUBCUTANEOUS
  Administered 2011-10-29: 4 [IU] via SUBCUTANEOUS
  Administered 2011-10-29 – 2011-10-30 (×2): 8 [IU] via SUBCUTANEOUS
  Administered 2011-10-30: 2 [IU] via SUBCUTANEOUS

## 2011-10-28 MED ORDER — DILTIAZEM HCL 60 MG PO TABS
60.0000 mg | ORAL_TABLET | Freq: Three times a day (TID) | ORAL | Status: DC
  Administered 2011-10-28 – 2011-10-30 (×6): 60 mg via ORAL
  Filled 2011-10-28 (×9): qty 1

## 2011-10-28 NOTE — Progress Notes (Addendum)
5 Days Post-Op Procedure(s) (LRB): FLEXIBLE BRONCHOSCOPY (N/A) THORACOTOMY MAJOR (Right) DRAINAGE OF PLEURAL EFFUSION (Right) DECORTICATION (Right)  Subjective: Patient continues to feel better.  Objective: Vital signs in last 24 hours: Patient Vitals for the past 24 hrs:  BP Temp Temp src Pulse Resp SpO2 Weight  10/28/11 0738 - - - - - 95 % -  10/28/11 0721 - 98.4 F (36.9 C) Oral - - - -  10/28/11 0600 139/73 mmHg - - 78  - 95 % -  10/28/11 0500 145/66 mmHg - - 79  - 94 % -  10/28/11 0440 - - - - - - 254 lb 6.6 oz (115.4 kg)  10/28/11 0400 147/71 mmHg 98.5 F (36.9 C) Oral 80  - 95 % -  10/28/11 0300 138/65 mmHg - - 74  - 95 % -  10/28/11 0200 134/67 mmHg - - 78  - 92 % -  10/28/11 0126 - - - - - 94 % -  10/28/11 0100 130/64 mmHg - - 80  - 93 % -  10/28/11 0000 146/71 mmHg 98.1 F (36.7 C) Oral 86  - 95 % -  10/27/11 2300 117/98 mmHg - - 84  - 96 % -  10/27/11 2200 137/70 mmHg - - 80  - 96 % -  10/27/11 2120 147/73 mmHg - - 86  - 94 % -  10/27/11 2032 - - - - - 96 % -  10/27/11 2011 171/79 mmHg 98.9 F (37.2 C) Oral 89  - 96 % -  10/27/11 1700 148/70 mmHg - - 87  18  95 % -  10/27/11 1600 154/64 mmHg - - 88  15  94 % -  10/27/11 1537 - - - - - 92 % -  10/27/11 1515 - 98.4 F (36.9 C) Oral - - - -  10/27/11 1300 159/78 mmHg - - 87  24  92 % -  10/27/11 1200 123/66 mmHg - - 76  13  92 % -  10/27/11 1121 - 98.5 F (36.9 C) Oral - - - -  10/27/11 1100 169/82 mmHg - - 96  24  93 % -  10/27/11 1000 151/71 mmHg - - 89  26  95 % -  10/27/11 0900 154/68 mmHg - - 98  22  96 % -    Current Weight  10/28/11 254 lb 6.6 oz (115.4 kg)      Intake/Output from previous day: 06/09 0701 - 06/10 0700 In: 3020 [P.O.:960; I.V.:1650; IV Piggyback:410] Out: 1910 [Urine:1900; Chest Tube:10]   Physical Exam:  Cardiovascular: RRR, no murmurs, gallops. Pulmonary: Clear to auscultation on left; diminished breath sound at right base and some rhonchi. Abdomen: Soft, non tender, bowel  sounds present. Extremities: Mild bilateral lower extremity edema. Wounds: Clean and dry.  No erythema or signs of infection.  Lab Results: CBC:  Basename 10/26/11 0431  WBC 13.1*  HGB 9.1*  HCT 28.8*  PLT 323   BMET:   Basename 10/26/11 0431  NA 138  K 3.7  CL 102  CO2 25  GLUCOSE 213*  BUN 22  CREATININE 0.94  CALCIUM 8.0*    PT/INR: No results found for this basename: LABPROT,INR in the last 72 hours ABG:  INR: Will add last result for INR, ABG once components are confirmed Will add last 4 CBG results once components are confirmed  Assessment/Plan:  1.Pulmonary-chest tube with scant drainage last 24 hours. Chest tube is to waterseal.There is no air leak.Possibly remove remaining tube  today.CXR this am shows no pneumothorax and persistent bilateral airspace disease.Final tissue culture showed no organisms or growth.WBC remains 13,100.On Zosyn and Zyvox.Wean O2 as tolerates (on 5 liters). 2.Management per CCM.  ZIMMERMAN,DONIELLE MPA-C 10/28/2011  D/c CT

## 2011-10-28 NOTE — Progress Notes (Signed)
Cardizem gtt d/c'd per MD (cardizem po given at 14:00). Will continue to monitor.

## 2011-10-28 NOTE — Progress Notes (Signed)
Utilization review completed.  

## 2011-10-28 NOTE — Plan of Care (Addendum)
Problem: Food-Medication Interaction ([place]-2.3) Goal: Nutrition education Formal process to instruct or train a patient/client in a skill or to impart knowledge to help patients/clients voluntarily manage or modify food choices and eating behavior to maintain or improve health.  Outcome: Completed/Met Date Met:  10/28/11 Low Tyramine education completed (patient continues to receive Zyvox). RD spoke with patient's daughter and explained reasoning for current diet restriction due to potential food-drug interaction. RD stated that patient may not go home on this medication; handouts provided from [employer]; RD changed diet order to Low Tyramine. PO intake 80-100% at meals; BMI = 34.2 kg/m2 (Obesity Class I). No further nutrition intervention indicated at this time. Please consult RD as needed.  Alger Memos Pager #: [phone]

## 2011-10-28 NOTE — Progress Notes (Addendum)
OT / PT Cancellation Note  Treatment cancelled today due to:  Pt light-headed and RN requested that PT/ OT check back in PM hours for treatment. OT / PT to continue to follow acutely.    Harrel Carina [place]   OTR/L Pager: [phone] Office: [phone] .

## 2011-10-28 NOTE — Progress Notes (Signed)
Agree.  10/28/2011 Veda Canning, PT Pager: [phone]

## 2011-10-28 NOTE — Evaluation (Signed)
Occupational Therapy Evaluation Patient Details Name: Peter Nunez MRN: [id number] DOB: 1940/10/02 Today's Date: 10/28/2011 Time: 1478-2956 OT Time Calculation (min): 43 min  OT Assessment / Plan / Recommendation Clinical Impression  Pt s/p thoracotomy due to empyema with resulting limitations due to pain and decr cardiopulmonary status. Will benefit from OT to incr independence prior to d/c with family (and ultimate d/c home alone).     OT Assessment  Patient needs continued OT Services    Follow Up Recommendations  Home health OT    Barriers to Discharge      Equipment Recommendations  None recommended by OT    Recommendations for Other Services    Frequency  Min 2X/week    Precautions / Restrictions Precautions Precautions: Fall   Pertinent Vitals/Pain See vitals from RN Beth and PT Larita Fife    ADL  Eating/Feeding: Simulated;Set up Where Assessed - Eating/Feeding: Edge of bed Grooming: Performed;Wash/dry hands;Set up Where Assessed - Grooming: Supported sitting Lower Body Dressing: Performed;+1 Total assistance (due to back pain with attempting) Where Assessed - Lower Body Dressing: Unsupported sitting Toilet Transfer: Simulated;Minimal assistance Toilet Transfer Method: Sit to stand Toilet Transfer Equipment: Raised toilet seat with arms (or 3-in-1 over toilet) Equipment Used: Rolling walker;Other (comment) (oxygen tank) Transfers/Ambulation Related to ADLs: see PT note for detail of distance. Pt required education on pursed lip breathing to decrease RR from 39 to 19 . ADL Comments: Pt unable to complete LB dressing due to back pain. Pt fatigue and dyspnea 3 out 4 with tasks. Pt expressing areas at home of energy conservation due to fatigue prior to admission (leaving dishes, placing clothes in laundry basket etc) Pt demonstrates decreased activity tolerance     OT Diagnosis: Generalized weakness  OT Problem List: Decreased strength;Decreased activity tolerance;Impaired  balance (sitting and/or standing);Decreased cognition;Decreased safety awareness;Decreased knowledge of use of DME or AE;Decreased knowledge of precautions OT Treatment Interventions: Self-care/ADL training;Energy conservation;DME and/or AE instruction;Therapeutic activities;Patient/family education;Balance training   OT Goals Acute Rehab OT Goals OT Goal Formulation: With patient Time For Goal Achievement: 11/11/11 Potential to Achieve Goals: Good ADL Goals Pt Will Perform Grooming: with set-up;Sitting, chair;Supported ADL Goal: Grooming - Progress: Goal set today Pt Will Perform Upper Body Bathing: with set-up;Sitting, chair;Supported ADL Goal: Product manager - Progress: Goal set today Pt Will Perform Lower Body Bathing: with set-up;Sitting, chair;Supported ADL Goal: Lower Body Bathing - Progress: Goal set today Pt Will Perform Upper Body Dressing: with set-up;Sitting, chair;Supported ADL Goal: Location manager Dressing - Progress: Goal set today Pt Will Perform Lower Body Dressing: with set-up;Sitting, chair;Supported ADL Goal: Lower Body Dressing - Progress: Goal set today Pt Will Transfer to Toilet: with set-up;3-in-1;Ambulation ADL Goal: Toilet Transfer - Progress: Goal set today Pt Will Perform Toileting - Clothing Manipulation: with set-up;Sitting on 3-in-1 or toilet ADL Goal: Toileting - Clothing Manipulation - Progress: Goal set today Pt Will Perform Toileting - Hygiene: with set-up;Sitting on 3-in-1 or toilet ADL Goal: Toileting - Hygiene - Progress: Goal set today  Visit Information  Last OT Received On: 10/28/11 Assistance Needed: +1 PT/OT Co-Evaluation/Treatment: Yes    Subjective Data  Subjective: "thats the devil beating his wife" pt expressing [place] saying for when it is raining but the sun is out at the same time Patient Stated Goal: to return home to cat Peter Nunez   Prior Functioning  Home Living Lives With: Alone Available Help at Discharge: Family Type of  Home: House Home Access: Stairs to enter [employer] of Steps: 2 Entrance  Stairs-Rails: Right;Left;Can reach both Home Layout: One level Bathroom Shower/Tub: Tub/shower unit;Walk-in shower;Curtain Bathroom Toilet: Standard Bathroom Accessibility: Yes How Accessible: Accessible via walker Home Adaptive Equipment: Walker - rolling;Wheelchair - manual Additional Comments: Pt plans to stay with his daughter until able to be home alone Prior Function Level of Independence: Independent Able to Take Stairs?: Reciprically Driving: Yes Vocation: Retired Musician: No difficulties Dominant Hand: Right    Cognition  Overall Cognitive Status: Appears within functional limits for tasks assessed/performed Arousal/Alertness: Awake/alert Orientation Level: Appears intact for tasks assessed Behavior During Session: [facility] for tasks performed    Extremity/Trunk Assessment Right Upper Extremity Assessment RUE ROM/Strength/Tone: [facility] for tasks assessed RUE Coordination:  (edema in hands) Left Upper Extremity Assessment LUE ROM/Strength/Tone: Within functional levels LUE Coordination:  (edema in hands) Right Lower Extremity Assessment RLE ROM/Strength/Tone: [facility] for tasks assessed Left Lower Extremity Assessment LLE ROM/Strength/Tone: [facility] for tasks assessed Trunk Assessment Trunk Assessment: Kyphotic   Mobility Bed Mobility Bed Mobility: Rolling Left;Left Sidelying to Sit;Sitting - Scoot to Edge of Bed Rolling Left: 5: Supervision Left Sidelying to Sit: 4: Min guard Sitting - Scoot to [place] of Bed: 5: Supervision Details for Bed Mobility Assistance: cues for technique to limit pain;  Transfers Sit to Stand: 4: Min guard;With upper extremity assist;From bed;Other (comment) Stand to Sit: 4: Min guard;With upper extremity assist;Without upper extremity assist;To elevated surface;To chair/3-in-1 Details for Transfer Assistance: Pt required instructional cues for safe use  of RW/hand sequencing   Exercise    Balance    End of Session OT - End of Session Activity Tolerance: Patient tolerated treatment well Patient left: in chair;with call bell/phone within reach;with family/visitor present (daughter) Nurse Communication: Mobility status   Lucile Shutters 10/28/2011, 2:57 PM    Lucile Shutters   OTR/L Pager: [phone] Office: [phone] .

## 2011-10-28 NOTE — Progress Notes (Signed)
Patient's right flank CT d/c'd per MD order per policy. Patient tolerated well, will continue to monitor.

## 2011-10-28 NOTE — Progress Notes (Signed)
Name: Peter Nunez MRN: [id number] DOB: 1940-06-16    LOS: 5   PULMONARY / CRITICAL CARE MEDICINE  HPI:  71yo male with hx HTN, DM  With R CAP and empyema s/p VATS  Subjective/Overnight:  Pt feels well, improving.  Vital Signs: Temp:  [98.1 F (36.7 C)-98.9 F (37.2 C)] 98.4 F (36.9 C) (06/10 0721) Pulse Rate:  [74-96] 78  (06/10 0600) Resp:  [13-24] 18  (06/09 1700) BP: (117-171)/(64-98) 139/73 mmHg (06/10 0600) SpO2:  [92 %-96 %] 95 % (06/10 0738) Weight:  [115.4 kg (254 lb 6.6 oz)] 115.4 kg (254 lb 6.6 oz) (06/10 0440)  Physical Examination: General:  NAD in bed  Neuro: awake, alert, MAE, follows commands  CV: s1s2 rrr PULM: resps even, non labored , diminished R base but much improved, few scattered ronchi, L clear, R CT x 1  w/ yellow serous drainage  GI: abd round, mildly distended, +bs Extremities:  Warm and dry, no edema.SCDs in place  Active Problems:  HTN (hypertension)  Diabetes mellitus  Acute respiratory failure  Empyema  Acute renal failure  Atrial fibrillation with RVR Atrial fib  ASSESSMENT AND PLAN  PULMONARY  CXR:  R CT in place.  Mild airspace dz on R ETT: 6/5>>>6/6  A:  Acute respiratory failure - remained vented overnight post op.  S/p extubation 6/6 POD#1 PNA/ Pleural effusion/ empyema - s/p VATS decortication 6/5 P:   CT per CVTS, hopefully out in next day or so F/u VATS cultures - see ID  Pain control Aggressive pulm hygiene   CARDIOVASCULAR  ECG:  NSR Lines:  R IJ CVL (OR) 6/5>>>  A: SIRS - resolved.       Hx HTN      Afib RVR -  resolved P:  D/c dilt drip.  Start PO diltiazem Restart BP regimen   RENAL  Lab 10/26/11 0431 10/25/11 0358 10/24/11 0500 10/23/11 2032 10/23/11 1512  NA 138 139 132* 133* 130*  K 3.7 3.7 -- -- --  CL 102 103 98 -- 95*  CO2 25 22 22  -- 21  BUN 22 43* 73* -- 75*  CREATININE 0.94 1.10 1.61* -- 1.96*  CALCIUM 8.0* 7.9* 7.9* -- 8.9  MG -- -- 2.7* -- 2.9*  PHOS -- -- 4.4 -- 3.9    Intake/Output      06/09 0701 - 06/10 0700 06/10 0701 - 06/11 0700   P.O. 960    I.V. (mL/kg) 1650 (14.3)    IV Piggyback 410    Total Intake(mL/kg) 3020 (26.2)    Urine (mL/kg/hr) 1900 (0.7) 175   Stool 0    Chest Tube 10    Total Output 1910 175   Net +1110 -175        Urine Occurrence 3 x    Stool Occurrence 1 x     Foley:  6/5>>  A:  Acute renal failure - in setting SIRS / ATB, resolved.  Hyponatremia  Lab 10/26/11 0431 10/25/11 0358 10/24/11 0500 10/23/11 1512  CREATININE 0.94 1.10 1.61* 1.96*   P:   Reduce IVF to [facility] F/u chem    GASTROINTESTINAL  A:  No active issue  P:   PPI  Diet   HEMATOLOGIC  Lab 10/26/11 0431 10/25/11 0358 10/24/11 0500 10/23/11 2032 10/23/11 1512  HGB 9.1* 9.2* 9.3* 10.2* 10.9*  HCT 28.8* 28.5* 28.0* 30.0* 33.8*  PLT 323 336 307 -- 372  INR -- -- -- -- 1.15  APTT -- -- -- -- --  A:   P:  Follow CBC SCD's for DVT proph  INFECTIOUS  Lab 10/26/11 0431 10/25/11 0358 10/24/11 0500 10/23/11 1512  WBC 13.1* 13.5* 15.8* 22.1*  PROCALCITON 1.79 -- -- 11.10   Cultures: BCx2 6/3 ([facility])>>>neg BC x 2 6/5>>>neg Urine 6/5>>> negative Sputum 6/5>>> neg Fluid cultures (VATS) 6/5>>>neg Tissue (R lung - VATS) 6/5>>> NEG   Antibiotics: Linezolid 6/5>>> Zosyn 6/5>>>  A:  PNA/empyema - s/p VATS  P:   F/u culture data  F/u culture data from [facility]  Broad spectrum abx  - changed to linezolid 6/5 was previously on vanc for days at [facility]  If cultures remain neg consider narrow abx in next 24-48 hours  See pulm section    ENDOCRINE  Lab 10/28/11 0717 10/28/11 0358 10/27/11 2010 10/27/11 1646 10/27/11 1215  GLUCAP 169* 107* 181* 232* 244*   A:  DM P:   ISSI , change CBG to tid ac and hs Cont  lantus 6/8  NEUROLOGIC  A:  No acute issue P:   Supportive care  Pt/ot   BEST PRACTICE / DISPOSITION Level of Care:  SD Primary Service:  pccm Consultants:  CVTS Code Status:  full Diet:  Carb mod  DVT Px:  scd GI  Px:  ppi Skin Integrity:  intact Social / Family:  Pt and daughter updated 6/10   Shan Levans, MD [place]  [phone]  Cell  [phone]  If no response or cell goes to voicemail, call beeper [phone]  10/28/2011  10:49 AM

## 2011-10-28 NOTE — Evaluation (Signed)
Physical Therapy Evaluation Patient Details Name: Peter Nunez MRN: [id number] DOB: [birth year] Today's Date: 10/28/2011 Time: 4098-1191 PT Time Calculation (min): 44 min  PT Assessment / Plan / Recommendation Clinical Impression  Pt s/p thoracotomy due to empyema with resulting limitations due to pain and decr cardiopulmonary status. Will benefit from PT to incr independence prior to d/c with family (and ultimate d/c home alone).    PT Assessment  Patient needs continued PT services    Follow Up Recommendations  Home health PT;Supervision for mobility/OOB    Barriers to Discharge None      lEquipment Recommendations  None recommended by PT    Recommendations for Other Services     Frequency Min 3X/week    Precautions / Restrictions     Pertinent Vitals/Pain RR 19-33 with activity; SaO2 93-100% on 5L [place] O2; Rt side pain 7/10      Mobility  Bed Mobility Bed Mobility: Rolling Left;Left Sidelying to Sit;Sitting - Scoot to Edge of Bed Rolling Left: 5: Supervision Left Sidelying to Sit: 4: Min guard Sitting - Scoot to [place] of Bed: 5: Supervision Details for Bed Mobility Assistance: cues for technique to limit pain;  Transfers Transfers: Sit to Stand;Stand to Sit Sit to Stand: 4: Min guard;With upper extremity assist;From bed;Other (comment) (window sill) Stand to Sit: 4: Min guard;With upper extremity assist;Without upper extremity assist;To elevated surface;To chair/3-in-1 (windowsill) Details for Transfer Assistance: Pt required instructional cues for safe use of RW/hand sequencing Ambulation/Gait Ambulation/Gait Assistance: 4: Min guard Ambulation Distance (Feet): 140 Feet (70 x 2 with seated rest on windowsill) Assistive device: Rolling walker Ambulation/Gait Assistance Details: cues for safe use of RW and for upright posture (pt tends to flex and laterally flex toward Rt side due to pain) Gait Pattern: Step-through pattern;Decreased stride length;Trunk flexed      Exercises     PT Diagnosis: Difficulty walking;Acute pain  PT Problem List: Decreased activity tolerance;Decreased mobility;Decreased knowledge of use of DME;Cardiopulmonary status limiting activity;Pain PT Treatment Interventions: DME instruction;Gait training;Stair training;Functional mobility training;Therapeutic activities;Patient/family education   PT Goals Acute Rehab PT Goals PT Goal Formulation: With patient Time For Goal Achievement: 11/04/11 Potential to Achieve Goals: Good Pt will go Supine/Side to Sit: with modified independence;with HOB 0 degrees PT Goal: Supine/Side to Sit - Progress: Goal set today Pt will go Sit to Supine/Side: with modified independence;with HOB 0 degrees PT Goal: Sit to Supine/Side - Progress: Goal set today Pt will go Sit to Stand: with modified independence;with upper extremity assist PT Goal: Sit to Stand - Progress: Goal set today Pt will Ambulate: >150 feet;with supervision;with least restrictive assistive device PT Goal: Ambulate - Progress: Goal set today Pt will Go Up / Down Stairs: 1-2 stairs;with rail(s);with supervision PT Goal: Up/Down Stairs - Progress: Goal set today  Visit Information  Last PT Received On: 10/28/11 Assistance Needed: +1 PT/OT Co-Evaluation/Treatment: Yes    Subjective Data  Subjective: "I'll do whatever you want me to do to get me better and go home." Patient Stated Goal: Back to living alone; back to dancing   Prior Functioning  Home Living Lives With: Alone Available Help at Discharge: Family (daughter is a Runner, broadcasting/film/video & off for summer) Type of Home: House ([facility]'s home information) Home Access: Stairs to enter Secretary/administrator of Steps: 2 Entrance Stairs-Rails: Right;Left;Can reach both Home Layout: One level Bathroom Shower/Tub: Tub/shower unit;Walk-in shower;Curtain Bathroom Toilet: Standard Bathroom Accessibility: Yes How Accessible: Accessible via walker Home Adaptive Equipment: Walker -  rolling;Wheelchair - manual (belonged to  his father-in-law; w/c was his wife's) Additional Comments: Pt plans to stay with his daughter until able to be home alone Prior Function Level of Independence: Independent Able to Take Stairs?: Reciprically Driving: Yes Vocation: Retired Musician: No difficulties Dominant Hand: Right    Cognition  Overall Cognitive Status: Appears within functional limits for tasks assessed/performed Arousal/Alertness: Awake/alert Orientation Level: Appears intact for tasks assessed Behavior During Session: [facility] for tasks performed    Extremity/Trunk Assessment Right Lower Extremity Assessment RLE ROM/Strength/Tone: [facility] for tasks assessed Left Lower Extremity Assessment LLE ROM/Strength/Tone: [facility] for tasks assessed Trunk Assessment Trunk Assessment: Kyphotic (limited by pain)   Balance    End of Session PT - End of Session Activity Tolerance: Patient limited by pain;Patient limited by fatigue Patient left: in chair;with call bell/phone within reach;with nursing in room;with family/visitor present Nurse Communication: Mobility status   Damaso Laday 10/28/2011, 2:19 PM  Pager [phone]

## 2011-10-28 NOTE — Progress Notes (Signed)
Inpatient Diabetes Program Recommendations  AACE/ADA: New Consensus Statement on Inpatient Glycemic Control (2009)  Target Ranges:  Prepandial:   less than 140 mg/dL      Peak postprandial:   less than 180 mg/dL (1-2 hours)      Critically ill patients:  140 - 180 mg/dL   Reason for Visit: Hyperglycemia  Inpatient Diabetes Program Recommendations Insulin - Basal: Patient currently on Lantus 10 units at bedtime. Correction (SSI): Currently on a correction scale that begins with 8 units at 201 mg/dl.  Insulin - Meal Coverage: Request MD consider adding 3 units meal coverage tid with meals in addition to correction so patient's CHO intake will be covered when he eats at least 50% of meal.  Note:  Results for TREYSHAUN, KEATTS (MRN [id number]) as of 10/28/2011 13:42  Ref. Range 10/27/2011 16:46 10/27/2011 20:10 10/28/2011 00:04 10/28/2011 03:58 10/28/2011 06:24 10/28/2011 07:17 10/28/2011 11:09  Glucose-Capillary Latest Range: 70-99 mg/dL 409 (H) 811 (H) 914 (H) 107 (H)  169 (H) 244 (H)   Request that MD orders Novolog correction scale from Glycemic Control Order Set and meal coverage. Jackline Castilla S. Elsie Lincoln, RN, CNS, CDE  [phone])

## 2011-10-29 ENCOUNTER — Inpatient Hospital Stay (HOSPITAL_COMMUNITY): Payer: Medicare Other

## 2011-10-29 LAB — BASIC METABOLIC PANEL
BUN: 8 mg/dL (ref 6–23)
Chloride: 100 mEq/L (ref 96–112)
Glucose, Bld: 166 mg/dL — ABNORMAL HIGH (ref 70–99)
Potassium: 2.8 mEq/L — ABNORMAL LOW (ref 3.5–5.1)

## 2011-10-29 LAB — CBC
HCT: 27.8 % — ABNORMAL LOW (ref 39.0–52.0)
Hemoglobin: 9 g/dL — ABNORMAL LOW (ref 13.0–17.0)
MCH: 27.8 pg (ref 26.0–34.0)
MCHC: 32.4 g/dL (ref 30.0–36.0)

## 2011-10-29 LAB — GLUCOSE, CAPILLARY
Glucose-Capillary: 208 mg/dL — ABNORMAL HIGH (ref 70–99)
Glucose-Capillary: 204 mg/dL — ABNORMAL HIGH (ref 70–99)
Glucose-Capillary: 174 mg/dL — ABNORMAL HIGH (ref 70–99)

## 2011-10-29 MED ORDER — POTASSIUM CHLORIDE 10 MEQ/100ML IV SOLN: 10.0000 meq | INTRAVENOUS | Status: AC

## 2011-10-29 MED ORDER — ACETAMINOPHEN 325 MG PO TABS
650.0000 mg | ORAL_TABLET | ORAL | Status: DC | PRN
  Administered 2011-10-29: 650 mg via ORAL
  Filled 2011-10-29: qty 2

## 2011-10-29 MED ORDER — POTASSIUM CHLORIDE 10 MEQ/50ML IV SOLN
INTRAVENOUS | Status: AC
  Administered 2011-10-29: 10 meq
  Filled 2011-10-29: qty 100

## 2011-10-29 MED ORDER — POTASSIUM CHLORIDE CRYS ER 20 MEQ PO TBCR
EXTENDED_RELEASE_TABLET | ORAL | Status: AC
  Administered 2011-10-29: 40 meq
  Filled 2011-10-29: qty 2

## 2011-10-29 MED ORDER — POTASSIUM CHLORIDE 10 MEQ/50ML IV SOLN
INTRAVENOUS | Status: AC
  Administered 2011-10-29: 10 meq
  Filled 2011-10-29: qty 50

## 2011-10-29 MED ORDER — MOXIFLOXACIN HCL 400 MG PO TABS
400.0000 mg | ORAL_TABLET | Freq: Every day | ORAL | Status: DC
  Administered 2011-10-29: 400 mg via ORAL
  Filled 2011-10-29 (×3): qty 1

## 2011-10-29 MED ORDER — POTASSIUM CHLORIDE 20 MEQ/15ML (10%) PO LIQD
40.0000 meq | Freq: Once | ORAL | Status: AC
  Filled 2011-10-29: qty 30

## 2011-10-29 NOTE — Progress Notes (Signed)
OT Cancellation Note  Treatment cancelled today due to pt sleeping currently.  Talked with pt's family member who states he was reporting being really wiped out after going down for his scan/xray earlier this morning.  Will re-attempt later today if time permits.  Loreley Schwall,Yianni OTR/L Pager number [phone]  10/29/2011, 9:59 AM

## 2011-10-29 NOTE — Progress Notes (Signed)
Occupational Therapy Treatment Patient Details Name: Peter Nunez MRN: [id number] DOB: 03-01-1941 Today's Date: 10/29/2011 Time: 1429-1510 OT Time Calculation (min): 41 min  OT Assessment / Plan / Recommendation Comments on Treatment Session Pt making good progress overall min assist for balance and selfcare tasks.  Will be safe to discharge home with his daughter.  Likely will reach supervision level in a day or so with more mobility and reduced pain.     Follow Up Recommendations  No OT follow up       Equipment Recommendations  None recommended by OT       Frequency Min 2X/week   Plan Discharge plan needs to be updated    Precautions / Restrictions Precautions Precautions: Fall Restrictions Weight Bearing Restrictions: No   Pertinent Vitals/Pain  Pt's O2 sats decreased to 88% at rest on room air.  Currently needs 4 liters of supplemental O2 to increase to 93%.   ADL  Grooming: Performed;Min guard;Wash/dry hands;Teeth care Where Assessed - Grooming: Supported standing Lower Body Dressing: Performed;Supervision/safety (To donn gripper socks EOB.) Where Assessed - Lower Body Dressing: Unsupported sitting Toilet Transfer: Performed;Minimal assistance Toilet Transfer Method: Stand pivot Toilet Transfer Equipment: Comfort height toilet;Grab bars Toileting - Clothing Manipulation and Hygiene: Maximal assistance (Pt unable to perfrom toilet hygiene.) Where Assessed - Toileting Clothing Manipulation and Hygiene: Sit to stand from 3-in-1 or toilet;Standing Transfers/Ambulation Related to ADLs: Pt min assist for mobility using the IV pole for balance.   ADL Comments: Pt able to prop LEs up on chair for donning socks at EOB.  Also discussed alternatives for toilet hygiene since pt lacks AROM enough to perfrom on his own.  Feel this will improve as pain in right flank scapula improve.  Instructed pt on purse lif breathing technique.  He reports difficulty with this secondary to having a  stopped up nose.  Nursing made aware of this.  Dyspnea 3/4 with activity but able to tolerate standing at the sink for 3-4 mins before needing a rest break.        OT Goals ADL Goals ADL Goal: Grooming - Progress: Progressing toward goals ADL Goal: Toilet Transfer - Progress: Progressing toward goals ADL Goal: Toileting - Clothing Manipulation - Progress: Progressing toward goals ADL Goal: Toileting - Hygiene - Progress: Progressing toward goals  Visit Information  Last OT Received On: 10/29/11 Assistance Needed: +1    Subjective Data  Subjective: "Your the first person that's asked me if i wanted to brush my teeth since I've been here." Patient Stated Goal: Go to his daughters house.      Cognition  Overall Cognitive Status: Appears within functional limits for tasks assessed/performed Arousal/Alertness: Awake/alert Orientation Level: Appears intact for tasks assessed Behavior During Session: [facility] for tasks performed    Mobility Bed Mobility Bed Mobility: Supine to Sit Supine to Sit: 4: Min assist;HOB flat Transfers Transfers: Sit to Stand Sit to Stand: 4: Min assist Stand to Sit: 4: Min guard      Balance Balance Balance Assessed: Yes Dynamic Standing Balance Dynamic Standing - Balance Support: Right upper extremity supported;Left upper extremity supported Dynamic Standing - Level of Assistance: 4: Min assist  End of Session OT - End of Session Activity Tolerance: Patient limited by fatigue Patient left: in bed;with call bell/phone within reach   [facility] OTR/L 10/29/2011, 4:34 PM Pager number [phone]

## 2011-10-29 NOTE — Progress Notes (Signed)
6 Days Post-Op Procedure(s) (LRB): FLEXIBLE BRONCHOSCOPY (N/A) THORACOTOMY MAJOR (Right) DRAINAGE OF PLEURAL EFFUSION (Right) DECORTICATION (Right)  Subjective: Patient not feeling all that well this morning. He has no specific complaints but thinks he "over did" yesterday. He walked 3 times.  Objective: Vital signs in last 24 hours: Patient Vitals for the past 24 hrs:  BP Temp Temp src Pulse Resp SpO2 Weight  10/29/11 0730 - 97.9 F (36.6 C) Oral - - - -  10/29/11 0500 - - - - - - 253 lb 15.5 oz (115.2 kg)  10/29/11 0355 124/60 mmHg 98.1 F (36.7 C) Oral 83  17  98 % -  10/29/11 0151 - - - - - 95 % -  10/29/11 0016 153/59 mmHg 98.3 F (36.8 C) Oral 80  16  95 % -  10/28/11 2044 143/67 mmHg 98.2 F (36.8 C) Axillary 85  26  93 % -  10/28/11 2041 - - - - - 95 % -  10/28/11 1600 161/64 mmHg 98.3 F (36.8 C) Oral 85  14  97 % -  10/28/11 1531 - - - - 21  96 % -  10/28/11 1429 - - - - - 95 % -  10/28/11 1200 158/70 mmHg 99.4 F (37.4 C) Oral 73  17  96 % -  10/28/11 0845 149/64 mmHg - - 104  32  96 % -    Current Weight  10/29/11 253 lb 15.5 oz (115.2 kg)      Intake/Output from previous day: 06/10 0701 - 06/11 0700 In: 1430 [I.V.:680; IV Piggyback:750] Out: 1360 [Urine:1350; Chest Tube:10]   Physical Exam:  Cardiovascular: RRR, no murmurs, gallops. Pulmonary: Clear to auscultation on left; diminished breath sound at right base. Abdomen: Soft, non tender, bowel sounds present. Extremities: Trace bilateral lower extremity edema. Wounds: Clean and dry.  No erythema or signs of infection.  Lab Results: CBC:  Basename 10/29/11 0400  WBC 13.5*  HGB 9.0*  HCT 27.8*  PLT 314   BMET:   Basename 10/29/11 0400  NA 137  K 2.8*  CL 100  CO2 27  GLUCOSE 166*  BUN 8  CREATININE 0.81  CALCIUM 8.0*    PT/INR: No results found for this basename: LABPROT,INR in the last 72 hours ABG:  INR: Will add last result for INR, ABG once components are confirmed Will add  last 4 CBG results once components are confirmed  Assessment/Plan:  1.Pulmonary-Remaining chest tube was removed yesterday .CXR this am shows no pneumothorax, small right pleural effusion,  and persistent bilateral airspace disease.Final tissue culture showed no organisms or growth.WBC remains 13,500.On Zosyn and Zyvox.Wean O2 as tolerates (on 4 liters). 2. Potassium being supplemented 3.Management per CCM.  Kennedey Digilio MPA-C 10/29/2011

## 2011-10-29 NOTE — Progress Notes (Signed)
ANTIBIOTIC CONSULT NOTE - FOLLOW UP  Pharmacy Consult for Zosyn Indication: Empyema, s/p thorocotomy  Allergies  Allergen Reactions  . Bee Venom     unknown    Patient Measurements: Height: 6' (182.9 cm) Weight: 253 lb 15.5 oz (115.2 kg) IBW/kg (Calculated) : 77.6  Adjusted Body Weight:   Vital Signs: Temp: 97.9 F (36.6 C) (06/11 0800) Temp src: Oral (06/11 0800) BP: 111/31 mmHg (06/11 0800) Pulse Rate: 76  (06/11 0800) Intake/Output from previous day: 06/10 0701 - 06/11 0700 In: 1450 [I.V.:700; IV Piggyback:750] Out: 1360 [Urine:1350; Chest Tube:10] Intake/Output from this shift: Total I/O In: 50 [IV Piggyback:50] Out: -   Labs:  Basename 10/29/11 0400  WBC 13.5*  HGB 9.0*  PLT 314  LABCREA --  CREATININE 0.81   Estimated Creatinine Clearance: 114.3 ml/min (by C-G formula based on Cr of 0.81). No results found for this basename: VANCOTROUGH:2,VANCOPEAK:2,VANCORANDOM:2,GENTTROUGH:2,GENTPEAK:2,GENTRANDOM:2,TOBRATROUGH:2,TOBRAPEAK:2,TOBRARND:2,AMIKACINPEAK:2,AMIKACINTROU:2,AMIKACIN:2, in the last 72 hours   Microbiology: Recent Results (from the past 720 hour(s))  MRSA PCR SCREENING     Status: Normal   Collection Time   10/23/11  2:07 PM      Component Value Range Status Comment   MRSA by PCR NEGATIVE  NEGATIVE  Final   URINE CULTURE     Status: Normal   Collection Time   10/23/11  2:23 PM      Component Value Range Status Comment   Specimen Description URINE, CLEAN CATCH   Final    Special Requests NONE   Final    Culture  Setup Time [id number]   Final    Colony Count NO GROWTH   Final    Culture NO GROWTH   Final    Report Status 10/24/2011 FINAL   Final   CULTURE, BLOOD (ROUTINE X 2)     Status: Normal (Preliminary result)   Collection Time   10/23/11  3:12 PM      Component Value Range Status Comment   Specimen Description BLOOD HAND RIGHT   Final    Special Requests BOTTLES DRAWN AEROBIC AND ANAEROBIC 10CC   Final    Culture  Setup Time [id number]    Final    Culture     Final    Value:        BLOOD CULTURE RECEIVED NO GROWTH TO DATE CULTURE WILL BE HELD FOR 5 DAYS BEFORE ISSUING A FINAL NEGATIVE REPORT   Report Status PENDING   Incomplete   CULTURE, BLOOD (ROUTINE X 2)     Status: Normal (Preliminary result)   Collection Time   10/23/11  3:16 PM      Component Value Range Status Comment   Specimen Description BLOOD HAND LEFT   Final    Special Requests BOTTLES DRAWN AEROBIC AND ANAEROBIC 10CC   Final    Culture  Setup Time [id number]   Final    Culture     Final    Value:        BLOOD CULTURE RECEIVED NO GROWTH TO DATE CULTURE WILL BE HELD FOR 5 DAYS BEFORE ISSUING A FINAL NEGATIVE REPORT   Report Status PENDING   Incomplete   CULTURE, RESPIRATORY     Status: Normal   Collection Time   10/23/11  7:21 PM      Component Value Range Status Comment   Specimen Description BRONCHIAL WASHINGS   Final    Special Requests NONE   Final    Gram Stain     Final    Value: RARE WBC  PRESENT,BOTH PMN AND MONONUCLEAR     RARE SQUAMOUS EPITHELIAL CELLS PRESENT     NO ORGANISMS SEEN   Culture NO GROWTH 2 DAYS   Final    Report Status 10/26/2011 FINAL   Final   BODY FLUID CULTURE     Status: Normal   Collection Time   10/23/11  8:07 PM      Component Value Range Status Comment   Specimen Description PLEURAL RIGHT FLUID   Final    Special Requests NONE   Final    Gram Stain     Final    Value: RARE WBC PRESENT,BOTH PMN AND MONONUCLEAR     NO ORGANISMS SEEN   Culture NO GROWTH 3 DAYS   Final    Report Status 10/27/2011 FINAL   Final   TISSUE CULTURE     Status: Normal   Collection Time   10/23/11  8:33 PM      Component Value Range Status Comment   Specimen Description TISSUE RIGHT LUNG   Final    Special Requests NONE   Final    Gram Stain     Final    Value: FEW WBC PRESENT,BOTH PMN AND MONONUCLEAR     NO SQUAMOUS EPITHELIAL CELLS SEEN     NO ORGANISMS SEEN   Culture NO GROWTH 3 DAYS   Final    Report Status 10/26/2011 FINAL   Final      Anti-infectives     Start     Dose/Rate Route Frequency Ordered Stop   10/23/11 2200  piperacillin-tazobactam (ZOSYN) IVPB 3.375 g       3.375 g 12.5 mL/hr over 240 Minutes Intravenous 3 times per day 10/23/11 1425     10/23/11 1600   linezolid (ZYVOX) IVPB 600 mg        600 mg 300 mL/hr over 60 Minutes Intravenous Every 12 hours 10/23/11 1437     10/23/11 1500   vancomycin (VANCOCIN) 1,500 mg in sodium chloride 0.9 % 500 mL IVPB  Status:  Discontinued        1,500 mg 250 mL/hr over 120 Minutes Intravenous Every 24 hours 10/23/11 1424 10/23/11 1437   10/23/11 1430  piperacillin-tazobactam (ZOSYN) IVPB 3.375 g       3.375 g 100 mL/hr over 30 Minutes Intravenous NOW 10/23/11 1424 10/23/11 1553          Assessment: 71yo male with empyema, s/p thorocotomy.  He is on day# 6 of Zyvox and Zosyn.  Blood cultures remain in process and are currently NTD.  All other cultures are negative.  Plan per MD progress notes is to narrow antibiotics.  Goal of Therapy:  Resolution of infection  Plan:  1.  Continue Zosyn 3.375gm IV q8, infusing over 4 hours 2.  Consider changing Zyvox to PO.  Marisue Humble, PharmD Clinical Pharmacist [facility]

## 2011-10-29 NOTE — Progress Notes (Signed)
Patient transferred to 61. Report called to RN on 4700 and all questions answered. Patient received 40 mEq po of potassium and 4 runs IV of potassium.  IV team aware that patient needs new PIV (2 RN's on 3300 tried unsuccessful) and that patient's central line needs to be d/c'd. Patient transferred via wheelchair with NT. Family aware of new room assignment.

## 2011-10-29 NOTE — Progress Notes (Signed)
Name: Peter Nunez MRN: [id number] DOB: Apr 21, 1941    LOS: 6   PULMONARY / CRITICAL CARE MEDICINE  HPI:  71yo male with hx HTN, DM  With R CAP and empyema s/p VATS  Subjective/Overnight:  Pt feels well, improving. Pt is weak from hypokalemia  Vital Signs: Temp:  [97.9 F (36.6 C)-99.4 F (37.4 C)] 97.9 F (36.6 C) (06/11 0800) Pulse Rate:  [73-85] 76  (06/11 0800) Resp:  [14-26] 18  (06/11 0800) BP: (111-161)/(31-70) 111/31 mmHg (06/11 0800) SpO2:  [93 %-98 %] 97 % (06/11 0854) Weight:  [115.2 kg (253 lb 15.5 oz)] 115.2 kg (253 lb 15.5 oz) (06/11 0500)  Physical Examination: General:  NAD in bed  Neuro: awake, alert, MAE, follows commands  CV: s1s2 rrr PULM: resps even, non labored , diminished R base but much improved, few scattered ronchi, L clear, CT out GI: abd round, mildly distended, +bs Extremities:  Warm and dry, no edema.SCDs in place  Active Problems:  HTN (hypertension)  Diabetes mellitus  Acute respiratory failure  Empyema  Acute renal failure  Atrial fibrillation with RVR Atrial fib  ASSESSMENT AND PLAN  PULMONARY  YQM:VHQIONGE aeration  ETT: 6/5>>>6/6  A:  Acute respiratory failure - remained vented overnight post op.  S/p extubation 6/6 POD#1 PNA/ Pleural effusion/ empyema - s/p VATS decortication 6/5 P:   Narrow ABX BD Oxygen tfr to tele bed   CARDIOVASCULAR  ECG:  NSR Lines:  R IJ CVL (OR) 6/5>>>6/11  A: SIRS - resolved.       Hx HTN      Afib RVR -  resolved P:  Cont po  diltiazem   RENAL  Lab 10/29/11 0400 10/26/11 0431 10/25/11 0358 10/24/11 0500 10/23/11 2032 10/23/11 1512  NA 137 138 139 132* 133* --  K 2.8* 3.7 -- -- -- --  CL 100 102 103 98 -- 95*  CO2 27 25 22 22  -- 21  BUN 8 22 43* 73* -- 75*  CREATININE 0.81 0.94 1.10 1.61* -- 1.96*  CALCIUM 8.0* 8.0* 7.9* 7.9* -- 8.9  MG -- -- -- 2.7* -- 2.9*  PHOS -- -- -- 4.4 -- 3.9   Intake/Output      06/10 0701 - 06/11 0700 06/11 0701 - 06/12 0700   P.O.     I.V.  (mL/kg) 700 (6.1)    IV Piggyback 750 50   Total Intake(mL/kg) 1450 (12.6) 50 (0.4)   Urine (mL/kg/hr) 1350 (0.5)    Chest Tube 10    Total Output 1360    Net +90 +50        Urine Occurrence 3 x 1 x   Stool Occurrence 4 x 1 x    Foley:  6/5>>  A:  Acute renal failure - in setting SIRS / ATB, resolved.  Hyponatremia  Hypokalemia  Lab 10/29/11 0400 10/26/11 0431 10/25/11 0358 10/24/11 0500 10/23/11 1512  CREATININE 0.81 0.94 1.10 1.61* 1.96*   P:   Replete K F/u bmet  GASTROINTESTINAL  A:  No active issue  P:   PPI  Diet   HEMATOLOGIC  Lab 10/29/11 0400 10/26/11 0431 10/25/11 0358 10/24/11 0500 10/23/11 2032 10/23/11 1512  HGB 9.0* 9.1* 9.2* 9.3* 10.2* --  HCT 27.8* 28.8* 28.5* 28.0* 30.0* --  PLT 314 323 336 307 -- 372  INR -- -- -- -- -- 1.15  APTT -- -- -- -- -- --   A:   P:  Follow CBC SCD's for DVT proph  INFECTIOUS  Lab 10/29/11 0400 10/26/11 0431 10/25/11 0358 10/24/11 0500 10/23/11 1512  WBC 13.5* 13.1* 13.5* 15.8* 22.1*  PROCALCITON -- 1.79 -- -- 11.10   Cultures: BCx2 6/3 ([place])>>>neg BC x 2 6/5>>>neg Urine 6/5>>> negative Sputum 6/5>>> neg Fluid cultures (VATS) 6/5>>>neg Tissue (R lung - VATS) 6/5>>> NEG   Antibiotics: Linezolid 6/5>>>6/11 Zosyn 6/5>>>6/11 6/11 avelox po (pna, empyema)>>  A:  PNA/empyema - s/p VATS  P:   Narrow to po avelox   ENDOCRINE  Lab 10/29/11 0727 10/28/11 2130 10/28/11 1657 10/28/11 1109 10/28/11 0717  GLUCAP 174* 194* 165* 244* 169*   A:  DM P:   SSI Cont  lantus 6/8  NEUROLOGIC  A:  No acute issue P:   Supportive care  Pt/ot   BEST PRACTICE / DISPOSITION Level of Care: tele Primary Service:  pccm Consultants:  CVTS Code Status:  full Diet:  Carb mod  DVT Px:  scd GI Px:  ppi Skin Integrity:  intact Social / Family:  Pt and daughter updated 6/11   Shan Levans, MD Beeper  [phone]  Cell  [phone]  If no response or cell goes to voicemail, call beeper  [phone]  10/29/2011  10:17 AM

## 2011-10-29 NOTE — Progress Notes (Signed)
PT Cancellation Note  Treatment cancelled today due to patient stating that he was worn out from yesterday and had gotten up to chair earlier in the morning. Patient requested to hold ambulation this morning and check back later this afternoon if I had time. Will attempt later this afternoon as able.   10/29/2011 Fredrich Birks PTA [phone] pager [phone] office     Fredrich Birks 10/29/2011, 10:28 AM

## 2011-10-29 NOTE — Progress Notes (Signed)
[facility] PCCM  K 2.8  Replaced  Yolonda Kida PCCM Pager: [phone] Cell: [phone] If no response, call [phone]

## 2011-10-30 LAB — CULTURE, BLOOD (ROUTINE X 2)
Culture  Setup Time: 201306060010
Culture: NO GROWTH

## 2011-10-30 LAB — CBC
HCT: 27.2 % — ABNORMAL LOW (ref 39.0–52.0)
Hemoglobin: 9 g/dL — ABNORMAL LOW (ref 13.0–17.0)
MCHC: 33.1 g/dL (ref 30.0–36.0)
RBC: 3.14 MIL/uL — ABNORMAL LOW (ref 4.22–5.81)

## 2011-10-30 LAB — BASIC METABOLIC PANEL
BUN: 8 mg/dL (ref 6–23)
CO2: 27 mEq/L (ref 19–32)
GFR calc non Af Amer: >90 mL/min (ref >90)
Glucose, Bld: 140 mg/dL — ABNORMAL HIGH (ref 70–99)
Potassium: 3.3 mEq/L — ABNORMAL LOW (ref 3.5–5.1)

## 2011-10-30 LAB — GLUCOSE, CAPILLARY: Glucose-Capillary: 233 mg/dL — ABNORMAL HIGH (ref 70–99)

## 2011-10-30 MED ORDER — ACETAMINOPHEN 325 MG PO TABS: 650.0000 mg | ORAL_TABLET | ORAL | Status: DC | PRN

## 2011-10-30 MED ORDER — SODIUM CHLORIDE 0.9 % IJ SOLN
10.0000 mL | INTRAMUSCULAR | Status: DC | PRN
  Administered 2011-10-30 (×3): 10 mL

## 2011-10-30 MED ORDER — SENNOSIDES-DOCUSATE SODIUM 8.6-50 MG PO TABS: 1.0000 | ORAL_TABLET | Freq: Every evening | ORAL | Status: DC | PRN

## 2011-10-30 MED ORDER — DILTIAZEM HCL ER COATED BEADS 120 MG PO CP24: 120.0000 mg | ORAL_CAPSULE | Freq: Every day | ORAL | Status: DC

## 2011-10-30 MED ORDER — METOPROLOL TARTRATE 25 MG PO TABS: 12.5000 mg | ORAL_TABLET | Freq: Two times a day (BID) | ORAL | Status: DC

## 2011-10-30 MED ORDER — OXYCODONE-ACETAMINOPHEN 5-325 MG PO TABS: 1.0000 | ORAL_TABLET | ORAL | Status: AC | PRN

## 2011-10-30 MED ORDER — MOXIFLOXACIN HCL 400 MG PO TABS: 400.0000 mg | ORAL_TABLET | Freq: Every day | ORAL | Status: AC

## 2011-10-30 NOTE — Progress Notes (Signed)
HOME HEALTH AGENCIES SERVING [place]   Agencies that are Medicare-Certified and are affiliated with [facility]  Telephone Number Address  [facility]   [facility] has ownership interest in this company; however, you are under no obligation to use this agency. [phone] or  [phone] [address] [place], [place] [address]   Agencies that are Medicare-Certified and are not affiliated with [facility] Agency Telephone Number Address  [facility] [phone] Fax [phone] [address] [place], [place]  [address]  [facility] [phone] or [phone] Fax [phone] [address] [place], [place] [address]  [facility] [phone] Fax [phone] [address] [place], [place] [address]  [facility] [phone] Fax [phone] [address] [place], [place]  [address]  [facility] [phone] Fax [phone] [address] Suite [place], [place] [address]  [facility] [phone] [address] [place], [place] [address]  [facility] [phone]  [address] [place], [place] [address]  [facility] [phone] or [phone] Fax [phone] [address] [place], [place]  [address]  [facility] [phone] Fax [phone] [address] [place], [place]  [address]  [facility]  [phone] Fax [phone] [address] [place], [place] [address]               Agencies that are not Medicare-Certified and are not affiliated with [facility] Agency Telephone Number Address  [facility] [place] [phone] or [phone] Fax [phone] [address], [place]  [address]  [facility] [phone] Fax [phone] [address] [place], [place]  [address]  [facility]  [phone] Fax [phone] [address] [place], [place] [address]  [facility] [phone] Fax [phone] [address] [place], [place] [address]  [facility] [phone] or [phone] Fax [phone] [address] [place], [place]  [address]  [facility] [phone] or [phone] Fax [phone] [address], Suite [place], [place]  [address]  [facility] [phone] Fax [phone] [address] [place], [place]  [address]  [facility] [phone] [address] [place], [place]  [address]  [facility] [phone] Fax [phone] [address] [place], [place]  [address]  [facility] [phone] Fax [phone] [address] [place], [place]  [address]  [facility] [phone] Fax [phone] [address] [place], [place] [address]  [facility] [phone] Fax [phone] [address] [place], [place]  [address]   In to see patient regarding possible home health needs. Choice of agencies offered. [facility]. Appropriate referrals  will be made.

## 2011-10-30 NOTE — Discharge Instructions (Addendum)
Thoracotomy  Care After  Refer to this sheet in the next few weeks. These instructions provide you with information on caring for yourself after your procedure. Your caregiver may also give you more specific instructions. Your treatment has been planned according to current medical practices, but problems sometimes occur. Call your caregiver if you have any problems or questions after your procedure.   HOME CARE INSTRUCTIONS  Remove the bandage (dressing) over your chest tube site as directed by your caregiver.  It is normal to be sore for a couple weeks following surgery. See your caregiver if this seems to be getting worse rather than better.  Only take over-the-counter or prescription medicines for pain, discomfort, or fever as directed by your caregiver. It is very important to take pain medicine when you need it so that you will cough and breathe deeply enough to clear mucus (phlegm) and expand your lungs. This helps prevent a lung infection (pneumonia).  If it hurts to cough, hold a pillow against your chest when you cough. This may help with the discomfort. In spite of the discomfort, cough frequently.  Taking deep breaths keeps lungs inflated and protects against pneumonia. Most patients will go home with a device called an incentive spirometer that encourages deep breathing.  You may resume a normal diet and activities as directed.  Use showers for bathing until you see your caregiver, or as instructed.  Change dressings if necessary or as directed.  Avoid lifting or driving until you are instructed otherwise.  Make an appointment to see your caregiver for stitch (suture) or staple removal when instructed.  Do not travel by airplane for 2 weeks after the chest tube is removed.  SEEK MEDICAL CARE IF:  You are bleeding from your wounds.  Your heartbeat seems irregular.  You have redness, swelling, or increasing pain in the wounds.  There is pus coming from your wounds.  There is a bad  smell coming from the wound or dressing.  SEEK IMMEDIATE MEDICAL CARE IF:  You have a fever.  You develop a rash.  You have difficulty breathing.  You develop any reaction or side effects to medicines given.  You develop lightheadedness or feel faint.  You develop shortness of breath or chest pain.  MAKE SURE YOU:  Understand these instructions.  Will watch your condition.  Will get help right away if you are not doing well or get worse.  Document Released: 10/19/2010 Document Revised: 04/25/2011 Document Reviewed: 10/19/2010  [facility] Patient Information 2012 [place], [place].   Home Health arranged with [facility]. [phone]. 1. Physical Therapy

## 2011-10-30 NOTE — Progress Notes (Signed)
Discharge instructions and prescriptions given to pt and daughter . Verbalized understanding.  Called for volunteer to transport to lobby .

## 2011-10-30 NOTE — Progress Notes (Signed)
Name: Peter Nunez MRN: [id number] DOB: 10/10/1940    LOS: 7   PULMONARY / CRITICAL CARE MEDICINE  HPI:  71yo male with hx HTN, DM  With R CAP and empyema s/p VATS  ETT: 6/5>>>6/6 Lines:  R IJ CVL (OR) 6/5>>>6/11  Cultures: BCx2 6/3 ([place])>>>neg BC x 2 6/5>>>neg Urine 6/5>>> negative Sputum 6/5>>> neg Fluid cultures (VATS) 6/5>>>neg Tissue (R lung - VATS) 6/5>>> NEG   Antibiotics: Linezolid 6/5>>>6/11 Zosyn 6/5>>>6/11 6/11 avelox po (pna, empyema)>>  Subjective/Overnight:  Pt feels well, improving. Pt is weak from hypokalemia  Vital Signs: Temp:  [97.3 F (36.3 C)-99.1 F (37.3 C)] 99.1 F (37.3 C) (06/12 0533) Pulse Rate:  [84-105] 84  (06/12 0533) Resp:  [18-19] 18  (06/12 0842) BP: (117-184)/(50-73) 117/64 mmHg (06/12 0842) SpO2:  [88 %-100 %] 96 % (06/12 0842) Weight:  [112.492 kg (248 lb)-113.807 kg (250 lb 14.4 oz)] 112.492 kg (248 lb) (06/12 0533)  Physical Examination: General:  NAD, tolerates walking in hall Neuro: awake, alert, MAE, follows commands  CV: s1s2 rrr PULM: resps even, non labored , diminished R base but much improved, few scattered ronchi, L clear, CT out, staples intact GI: abd round,+bs Extremities:  Warm and dry, no edema.SCDs in place   Lab 10/30/11 0539 10/29/11 0400 10/26/11 0431  NA 140 137 138  K 3.3* 2.8* 3.7  CL 102 100 102  CO2 27 27 25   BUN 8 8 22   CREATININE 0.79 0.81 0.94  GLUCOSE 140* 166* 213*    Lab 10/30/11 0539 10/29/11 0400 10/26/11 0431  HGB 9.0* 9.0* 9.1*  HCT 27.2* 27.8* 28.8*  WBC 14.8* 13.5* 13.1*  PLT 341 314 323     Active Problems:  HTN (hypertension)  Diabetes mellitus  Acute respiratory failure (resolved)  Empyema  Acute renal failure (resolved)  Atrial fibrillation with RVR  (resolved)  ASSESSMENT AND PLAN Acute respiratory failure - remained vented overnight post op.  S/p extubation 6/6 POD#1, now resolved.   PNA/ Pleural effusion/ empyema  W/ resultant sepsis/ now resolved. - s/p  VATS decortication 6/5. Now able to walk in hall with out dyspnea. His lowest sats hit 89% only briefly and recovered quickly to 94% on room air. He states his pain in tolerable. Has no evidence of drainage or discharge from the surgical site. From a medical standpoint he is clear for discharge, spoke w/ thoracic surgery who will bring him in next week to have staples removed.  Plan -Narrowed antibiotics, will send him home on avelox to complete 14d total rx -F/U with thoracics next week for staple removal and general post-op assessment in 2 weeks.  -prn analgesia  PAF w/ RVR. This was due to his acute illness and  resolved spontaneously.   Hx HTN   P:  -Cont po  Diltiazem, will send him home on this instead of norvasc -Resume hctz -will hold off on resuming his Ace-I given recent renal injury and the fact that his BP is controlled currently. Will have him f/u with his PCP to decide on if and when this can be resumed.   Acute renal failure - in setting SIRS / ATB, resolved.   Lab 10/30/11 0539 10/29/11 0400 10/26/11 0431 10/25/11 0358 10/24/11 0500  CREATININE 0.79 0.81 0.94 1.10 1.61*   Hypokalemia Plan: -replace prior to d/c   Anemia   Lab 10/30/11 0539 10/29/11 0400 10/26/11 0431  HGB 9.0* 9.0* 9.1*   P:  Follow CBC in out-pt setting  DM  Lab  10/30/11 0556 10/29/11 2204 10/29/11 1606 10/29/11 1237 10/29/11 0727  GLUCAP 136* 211* 208* 203* 174*   P:   -resume his oral metformin   BABCOCK,PETE,  10/30/2011  8:50 AM  Reviewed above, and agree.  Coralyn Helling, MD [facility] Pulmonary/Critical Care 10/30/2011, 2:44 PM Pager:  [phone] After 3pm call: [phone]

## 2011-10-30 NOTE — Discharge Summary (Signed)
Physician Discharge Summary     Patient ID: Peter Nunez MRN: [id number] DOB/AGE: [birth year] 71 y.o.  Admit date: 10/23/2011 Discharge date: 10/30/2011  Admission Diagnoses:  Discharge Diagnoses:  Active Problems:  HTN (hypertension)  Diabetes mellitus  Acute respiratory failure  Empyema  Acute renal failure  Atrial fibrillation with RVR   Significant Hospital tests/ studies/ interventions and procedures   ETT: 6/5>>>6/6  Lines:  R IJ CVL (OR) 6/5>>>6/11  6/5: 1. Flexible fiberoptic bronchoscopy. 2. Right muscle-sparing thoracotomy with drainage of right empyema and decortication of the right lung.  Cultures:  BCx2 6/3 ([place])>>>neg  BC x 2 6/5>>>neg  Urine 6/5>>> negative  Sputum 6/5>>> neg  Fluid cultures (VATS) 6/5>>>neg  Tissue (R lung - VATS) 6/5>>> NEG   Antibiotics:  Linezolid 6/5>>>6/11  Zosyn 6/5>>>6/11  6/11 avelox po (pna, empyema)>>    Brief history HPI: 71yo male with hx HTN, DM With R CAP and empyema s/p VATS   Hospital Course:  Acute respiratory failure - remained vented overnight post op. S/p extubation 6/6 POD#1, now resolved.   PNA/ Pleural effusion/ empyema W/ resultant sepsis/ now resolved. - Presented on 6/2 to [facility] hospital with 2 week hx cough, fevers (up to 106), chills and increasing SOB. He was admitted with PNA and treated with Rocephin/Azithro but cont to worsen and CT chest revealed large R effusion v empyema and pt was tx to [facility] for further eval. Of note saw his PCP in "early spring" and was rx with Z-pac for pulmonary infection.  s/p VATS decortication 6/5. Now able to walk in hall with out dyspnea. His lowest sats hit 89% only briefly and recovered quickly to 94% on room air. He states his pain in tolerable. Has no evidence of drainage or discharge from the surgical site. From a medical standpoint he is clear for discharge, spoke w/ thoracic surgery who will bring him in next week to have staples removed.  Recommendation:    -Narrowed antibiotics, will send him home on avelox to complete 14d total rx  -F/U with thoracics next week for staple removal and general post-op assessment in 2 weeks.  -prn analgesia   PAF w/ RVR. This was due to his acute illness and resolved spontaneously.  Recommendation:  Home on lopressor and ccb  Hx HTN  Recommendation:  -Cont po Diltiazem, will send him home on this instead of norvasc  -Resume hctz  -will hold off on resuming his Ace-I given recent renal injury and the fact that his BP is controlled currently. Will have him f/u with his PCP to decide on if and when this can be resumed.   Acute renal failure - in setting SIRS / acute tubular necorosis, resolved. Treated in usual fashion with IV fluid resuscitation efforts. As well as supportive care.  Lab  10/30/11 0539  10/29/11 0400  10/26/11 0431  10/25/11 0358  10/24/11 0500   CREATININE  0.79  0.81  0.94  1.10  1.61*   Recommendation:  F/u chemistry in out-pt setting.  Hypokalemia  Plan:  -replace prior to d/c   Anemia: no acute evidence of bleeding Lab  10/30/11 0539  10/29/11 0400  10/26/11 0431   HGB  9.0*  9.0*  9.1*    Recommendation:  Follow CBC in out-pt setting   DM  Lab  10/30/11 0556  10/29/11 2204  10/29/11 1606  10/29/11 1237  10/29/11 0727   GLUCAP  136*  211*  208*  203*  174*   Recommendation:  -resume  his oral metformin    Discharge Exam: BP 117/64  Pulse 84  Temp 99.1 F (37.3 C) (Oral)  Resp 18  Ht 6' (1.829 m)  Wt 112.492 kg (248 lb)  BMI 33.63 kg/m2  SpO2 96% room air  Physical Examination:  General: NAD, tolerates walking in hall  Neuro: awake, alert, MAE, follows commands  CV: s1s2 rrr  PULM: resps even, non labored , diminished R base but much improved, few scattered ronchi, L clear, CT out, staples intact  GI: abd round,+bs  Extremities: Warm and dry, no edema.SCDs in place   Labs at discharge Lab Results  Component Value Date   CREATININE 0.79 10/30/2011   BUN 8  10/30/2011   NA 140 10/30/2011   K 3.3* 10/30/2011   CL 102 10/30/2011   CO2 27 10/30/2011   Lab Results  Component Value Date   WBC 14.8* 10/30/2011   HGB 9.0* 10/30/2011   HCT 27.2* 10/30/2011   MCV 86.6 10/30/2011   PLT 341 10/30/2011   Lab Results  Component Value Date   ALT 14 10/25/2011   AST 21 10/25/2011   ALKPHOS 82 10/25/2011   BILITOT 0.2* 10/25/2011   Lab Results  Component Value Date   INR 1.15 10/23/2011    Current radiology studies Dg Chest 2 View  10/29/2011  *RADIOLOGY REPORT*  Clinical Data: Pleural effusion/empyema.  Postoperative for decortication.  Shortness of breath.  CHEST - 2 VIEW  Comparison: 10/28/2011  Findings: Right internal jugular central venous catheter projects over the SVC.  Continued right pleural thickening noted.  Stable density along the right mediastinal margin is present.  Improved aeration noted above the minor fissure, with interstitial accentuation bilaterally.  A small left pleural effusion is present along with mild cardiomegaly.  IMPRESSION:  1.  Improved aeration in the right lung, although bilateral interstitial and right greater than left airspace opacities persist. 2.  Continued pleural thickening on the right.  There may be a loculated component of pleural effusion along the right mediastinal margin. 3.  Small left pleural effusion.  Original Report Authenticated By: Dellia Cloud, M.D.    Disposition: Home   Discharge Orders    Future Appointments: Provider: Department: Dept Phone: Center:   11/05/2011 10:30 AM [facility] [phone] [facility]   11/19/2011 3:30 PM Alleen Borne, MD [facility] [phone] [facility]     Medication List  As of 10/30/2011 11:33 AM   STOP taking these medications         amLODipine 5 MG tablet      lisinopril 40 MG tablet         TAKE these medications         acetaminophen 325 MG tablet   Commonly known as: TYLENOL   Take 2 tablets (650 mg total) by mouth every 4 (four) hours as  needed.      diltiazem 120 MG 24 hr capsule   Commonly known as: CARDIZEM CD   Take 1 capsule (120 mg total) by mouth daily.      hydrochlorothiazide 25 MG tablet   Commonly known as: HYDRODIURIL   Take 25 mg by mouth daily.      lansoprazole 30 MG capsule   Commonly known as: PREVACID   Take 30 mg by mouth daily.      metFORMIN 500 MG tablet   Commonly known as: GLUCOPHAGE   Take 500 mg by mouth 2 (two) times daily with a meal.  metoprolol tartrate 25 MG tablet   Commonly known as: LOPRESSOR   Take 0.5 tablets (12.5 mg total) by mouth 2 (two) times daily.      moxifloxacin 400 MG tablet   Commonly known as: AVELOX   Take 1 tablet (400 mg total) by mouth daily at 6 PM.      niacin 500 MG CR tablet   Commonly known as: NIASPAN   Take 500 mg by mouth at bedtime.      oxyCODONE-acetaminophen 5-325 MG per tablet   Commonly known as: PERCOCET   Take 1-2 tablets by mouth every 4 (four) hours as needed.      senna-docusate 8.6-50 MG per tablet   Commonly known as: Senokot-S   Take 1 tablet by mouth at bedtime as needed.           Follow-up Information    Follow up with Alleen Borne, MD. (PA/LAT CXR to be taken on 11/19/2011 at 2:30 pm;Appointment with Dr. Laneta Simmers is on 11/19/2011 at 3:30 pm)    Contact information:   [address] [place] [address] [phone]       Follow up with Nurse at Dr. Sharee Pimple office. (Right chest staples and silk sutures to be removed on Tuesday 11/05/2011 at 10:30 am )       Schedule an appointment as soon as possible for a visit with Clayborn Bigness, MD. (call [phone] )    Contact information:   [address] [place] [address] [phone]          Discharged Condition: good  Physician Statement:   The Patient was personally examined, the discharge assessment and plan has been personally reviewed and I agree with ACNP Babcock's assessment and plan. > 30 minutes of time have  been dedicated to discharge assessment, planning and discharge instructions.   SignedAnders Simmonds 10/30/2011, 11:33 AM  Coralyn Helling, MD [facility] Pulmonary/Critical Care 10/30/2011, 2:44 PM Pager:  [phone] After 3pm call: [phone]

## 2011-10-31 ENCOUNTER — Other Ambulatory Visit: Payer: Self-pay | Admitting: Surgery

## 2011-10-31 DIAGNOSIS — J869 Pyothorax without fistula: Secondary | ICD-10-CM

## 2011-11-05 ENCOUNTER — Ambulatory Visit (INDEPENDENT_AMBULATORY_CARE_PROVIDER_SITE_OTHER): Payer: Self-pay

## 2011-11-05 DIAGNOSIS — D381 Neoplasm of uncertain behavior of trachea, bronchus and lung: Secondary | ICD-10-CM

## 2011-11-05 DIAGNOSIS — Z4802 Encounter for removal of sutures: Secondary | ICD-10-CM

## 2011-11-05 NOTE — Progress Notes (Signed)
D/C'd patients CTS and staples per MD order and policy.  Patient tolerated well.  Site is healing with no drainage.

## 2011-11-19 ENCOUNTER — Encounter: Payer: Self-pay | Admitting: Surgery

## 2011-11-28 ENCOUNTER — Other Ambulatory Visit: Payer: Self-pay | Admitting: Surgery

## 2011-11-28 ENCOUNTER — Institutional Professional Consult (permissible substitution): Payer: Medicare Other | Admitting: Pulmonary Disease

## 2011-11-28 DIAGNOSIS — D381 Neoplasm of uncertain behavior of trachea, bronchus and lung: Secondary | ICD-10-CM

## 2011-11-28 DIAGNOSIS — J869 Pyothorax without fistula: Secondary | ICD-10-CM

## 2011-12-03 ENCOUNTER — Ambulatory Visit
Admission: RE | Admit: 2011-12-03 | Discharge: 2011-12-03 | Disposition: A | Discharge status: Home / Self Care | Payer: Medicare Other | Source: Ambulatory Visit | Attending: Surgery | Admitting: Surgery

## 2011-12-03 ENCOUNTER — Ambulatory Visit (INDEPENDENT_AMBULATORY_CARE_PROVIDER_SITE_OTHER): Payer: Medicare Other | Admitting: Surgery

## 2011-12-03 ENCOUNTER — Encounter: Payer: Self-pay | Admitting: Surgery

## 2011-12-03 VITALS — BP 173/89 | HR 80 | Resp 18 | Ht 72.0 in | Wt 226.0 lb

## 2011-12-03 DIAGNOSIS — Z9889 Other specified postprocedural states: Secondary | ICD-10-CM

## 2011-12-03 DIAGNOSIS — D381 Neoplasm of uncertain behavior of trachea, bronchus and lung: Secondary | ICD-10-CM

## 2011-12-03 DIAGNOSIS — Z09 Encounter for follow-up examination after completed treatment for conditions other than malignant neoplasm: Secondary | ICD-10-CM

## 2011-12-03 DIAGNOSIS — J869 Pyothorax without fistula: Secondary | ICD-10-CM

## 2011-12-03 NOTE — Progress Notes (Signed)
[address]            [place] [address]          [phone]      HPI: Patient returns for routine postoperative follow-up having undergone right thoracotomy for drainage of right empyema and decortication of the right lung on 10/23/2011. This was probably related to right lung pneumonia followed by parapneumonic effusion. The patient's early postoperative recovery while in the hospital was notable for an uncomplicated postoperative course. Since hospital discharge the patient reports he has been feeling well overall. He still has some tightness along the right lateral aspect of the chest as well as some numbness along the right submammary region. He denies any cough or sputum production. He has had no shortness of breath..   Current Outpatient Prescriptions  Medication Sig Dispense Refill  . acetaminophen (TYLENOL) 325 MG tablet Take 2 tablets (650 mg total) by mouth every 4 (four) hours as needed.      . diltiazem (CARDIZEM CD) 120 MG 24 hr capsule Take 1 capsule (120 mg total) by mouth daily.  30 capsule  6  . hydrochlorothiazide (HYDRODIURIL) 25 MG tablet Take 25 mg by mouth daily.      . lansoprazole (PREVACID) 30 MG capsule Take 30 mg by mouth daily.      . metFORMIN (GLUCOPHAGE) 500 MG tablet Take 500 mg by mouth 2 (two) times daily with a meal.      . metoprolol tartrate (LOPRESSOR) 25 MG tablet Take 0.5 tablets (12.5 mg total) by mouth 2 (two) times daily.  30 tablet  6  . niacin (NIASPAN) 500 MG CR tablet Take 500 mg by mouth at bedtime.      . senna-docusate (SENOKOT-S) 8.6-50 MG per tablet Take 1 tablet by mouth at bedtime as needed.        Physical Exam: BP 173/89  Pulse 80  Resp 18  Ht 6' (1.829 m)  Wt 226 lb (102.513 kg)  BMI 30.65 kg/m2  SpO2 96% He looks well. Lung exam is clear. The right thoracotomy incision is well-healed.  Diagnostic Tests:  *RADIOLOGY REPORT*   Clinical Data: Right effusion.   CHEST - 2 VIEW     Comparison: 10/29/2011   Findings: There has been complete clearing of the interstitial infiltrates in the right lung since the prior study.  Left lung is now clear as well.  There is minimal residual blunting of the right costophrenic angle with minimal residual pleural thickening.   Heart size and vascularity are normal.  Mediastinum is no longer prominent.   No osseous abnormality.   IMPRESSION:   1.  Resolution of the bilateral interstitial infiltrates. 2.  Almost complete resolution of the pleural effusion and pleural thickening on the right.   Original Report Authenticated By: Gwynn Burly, M.D.   Impression:  He is making a good recovery following his surgery. His chest x-ray findings are continuing to resolve. The tightness and numbness along the right lateral chest wall is related to the incision and the effects of retraction on the intercostal nerves. I would expect this to continue to improve over the next 6-12 months to complete resolution. I told him he can return to driving a car but should refrain from lifting anything heavier than 10 pounds for about 3 months postoperatively.  Plan:  He will continue followup with his primary physician. I  don't think he requires any further chest x-ray followup.

## 2011-12-12 ENCOUNTER — Ambulatory Visit: Payer: Self-pay | Admitting: Family Medicine

## 2011-12-12 LAB — CBC WITH DIFFERENTIAL/PLATELET
Basophil %: 1.2 %
Eosinophil %: 2 %
HGB: 12.5 g/dL — ABNORMAL LOW (ref 13.0–18.0)
Lymphocyte %: 16.8 %
MCH: 27.9 pg (ref 26.0–34.0)
Monocyte #: 1 x10 3/mm (ref 0.2–1.0)
Neutrophil %: 65.6 %
RBC: 4.47 10*6/uL (ref 4.40–5.90)

## 2011-12-24 ENCOUNTER — Encounter: Payer: Self-pay | Admitting: Pulmonary Disease

## 2011-12-24 ENCOUNTER — Ambulatory Visit (INDEPENDENT_AMBULATORY_CARE_PROVIDER_SITE_OTHER): Payer: Medicare Other | Admitting: Pulmonary Disease

## 2011-12-24 VITALS — BP 156/88 | HR 71 | Temp 98.1°F | Ht 72.0 in | Wt 226.4 lb

## 2011-12-24 DIAGNOSIS — J869 Pyothorax without fistula: Secondary | ICD-10-CM

## 2011-12-24 NOTE — Patient Instructions (Addendum)
Get a chest x-ray at [facility] in three months.  See [place] one week after you have the chest x-ray.

## 2011-12-24 NOTE — Progress Notes (Signed)
Subjective:    Patient ID: Peter Nunez, male    DOB: [birth year], 71 y.o.   MRN: [id number]  HPI  [name] is a very pleasant 71 year old male that comes to our clinic today for followup from a recent hospitalization. In June of 2013 he was hospitalized at [facility] for pneumonia and a loculated pleural effusion. This is associated with respiratory failure renal failure and septic shock. He was transferred to [facility] where he received aggressive IV antibiotics and underwent a VATS decortication. All cultures performed at [facility] were negative but based on the clinical situation it was presumed that he had severe community acquired pneumonia with an empyema. He is here today for followup of the same.  He states he has had some numbness and itching around the surgical site otherwise he's been doing fairly well. 2 weeks ago he had fever and a sore throat associated with myalgias. He saw his primary care physician who ordered a chest x-ray at [facility] and wrote a course for Levaquin for one week. The symptoms only lasted for 2 days have not recurred since. In general he feels like he's done quite well since the hospital discharge.  He tells me that prior to hospital discharge he had no respiratory symptoms including shortness of breath, cough, or wheezing. He did smoke one pack of cigarettes daily for 20 years and quit in 1982.    Past Medical History  Diagnosis Date  . HTN (hypertension)   . Diabetes mellitus   . Hyperlipidemia      No family history on file.   History   Social History  . Marital Status: Widowed    Spouse Name: N/A    Number of Children: N/A  . Years of Education: N/A   Occupational History  . retired Fish farm manager    . retired Electronics engineer     Social History Main Topics  . Smoking status: Former Smoker -- 1.0 packs/day for 20 years    Types: Cigarettes    Quit date: 10/22/1980  . Smokeless tobacco: Not on file  . Alcohol Use: No  . Drug Use: Not on file    . Sexually Active: Not on file   Other Topics Concern  . Not on file   Social History Narrative  . No narrative on file     Allergies  Allergen Reactions  . Bee Venom     unknown  . Percocet (Oxycodone-Acetaminophen) Other (See Comments)    hallucinations     Outpatient Prescriptions Prior to Visit  Medication Sig Dispense Refill  . diltiazem (CARDIZEM CD) 120 MG 24 hr capsule Take 1 capsule (120 mg total) by mouth daily.  30 capsule  6  . hydrochlorothiazide (HYDRODIURIL) 25 MG tablet Take 25 mg by mouth daily.      . lansoprazole (PREVACID) 30 MG capsule Take 30 mg by mouth daily.      . metFORMIN (GLUCOPHAGE) 500 MG tablet Take 500 mg by mouth 2 (two) times daily with a meal.      . metoprolol tartrate (LOPRESSOR) 25 MG tablet Take 0.5 tablets (12.5 mg total) by mouth 2 (two) times daily.  30 tablet  6  . niacin (NIASPAN) 500 MG CR tablet Take 500 mg by mouth at bedtime.      Marland Kitchen acetaminophen (TYLENOL) 325 MG tablet Take 2 tablets (650 mg total) by mouth every 4 (four) hours as needed.      . senna-docusate (SENOKOT-S) 8.6-50 MG per tablet  Take 1 tablet by mouth at bedtime as needed.           Review of Systems  Constitutional: Negative for fever, chills, activity change and appetite change.  HENT: Negative for hearing loss, ear pain, congestion, rhinorrhea, sneezing, neck pain, neck stiffness, postnasal drip and sinus pressure.   Eyes: Negative for redness, itching and visual disturbance.  Respiratory: Negative for cough, chest tightness, shortness of breath and wheezing.   Cardiovascular: Negative for chest pain, palpitations and leg swelling.  Gastrointestinal: Negative for nausea, vomiting, abdominal pain, diarrhea, constipation, blood in stool and abdominal distention.  Musculoskeletal: Negative for myalgias, joint swelling, arthralgias and gait problem.  Skin: Negative for rash.  Neurological: Negative for dizziness, light-headedness, numbness and headaches.   Hematological: Does not bruise/bleed easily.  Psychiatric/Behavioral: Negative for confusion and dysphoric mood.       Objective:   Physical Exam   Filed Vitals:   12/24/11 1359  BP: 156/88  Pulse: 71  Temp: 98.1 F (36.7 C)  TempSrc: Oral  Height: 6' (1.829 m)  Weight: 226 lb 6.4 oz (102.694 kg)  SpO2: 97%   Gen: well appearing, no acute distress HEENT: NCAT, PERRL, EOMi, OP clear, neck supple without masses PULM: Very few insp crackles in R base, otherwise clear; surgical scar R chest well healed CV: RRR, no mgr, no JVD AB: BS+, soft, nontender, no hsm Ext: warm, no edema, no clubbing, no cyanosis Derm: no rash or skin breakdown Neuro: A&Ox4, CN II-XII intact, strength 5/5 in all 4 extremities  December 12, 2011 CXR [facility]: minimal pleural effusion and slight atelectasis in R base, much improved from prior studies  June 2013 CT chest [facility]: loculated R pleural effusion with mediastinal lymphadenopathy, RLL infiltrate     Assessment & Plan:   Empyema [name] comes her clinic today for followup after his hospitalization for severe community acquired pneumonia with respiratory failure, septic shock and associated empyema. He has done remarkably well since hospital discharge and is essentially asymptomatic today. He has some very mild numbness and itching associated with the surgical scar but apart from that he has no significant respiratory symptoms. My review of his chest x-rays from 11/28/2011 as well as 12/12/2011 show progressive improvement in his imaging. I personally reviewed the CT scan from [facility] and saw no clear mass or other evidence of malignancy. He did have some mediastinal lymphadenopathy noted during that study but this would be expected in the setting of massive infectious burden in the chest.  Plan: -He has had a pneumonia shot in the last 3 years there is no need to update that. -Continue exercise and follow up with primary care physician for treatment of his  other comorbidities including diabetes and hypertension. -We will see him back in 3 months with a repeat chest x-ray to ensure resolution after that he can probably follow up with [place] on an as needed as needed basis   Updated Medication List Outpatient Encounter Prescriptions as of 12/24/2011  Medication Sig Dispense Refill  . diltiazem (CARDIZEM CD) 120 MG 24 hr capsule Take 1 capsule (120 mg total) by mouth daily.  30 capsule  6  . hydrochlorothiazide (HYDRODIURIL) 25 MG tablet Take 25 mg by mouth daily.      . lansoprazole (PREVACID) 30 MG capsule Take 30 mg by mouth daily.      . metFORMIN (GLUCOPHAGE) 500 MG tablet Take 500 mg by mouth 2 (two) times daily with a meal.      .  metoprolol tartrate (LOPRESSOR) 25 MG tablet Take 0.5 tablets (12.5 mg total) by mouth 2 (two) times daily.  30 tablet  6  . niacin (NIASPAN) 500 MG CR tablet Take 500 mg by mouth at bedtime.      Marland Kitchen DISCONTD: acetaminophen (TYLENOL) 325 MG tablet Take 2 tablets (650 mg total) by mouth every 4 (four) hours as needed.      Marland Kitchen DISCONTD: senna-docusate (SENOKOT-S) 8.6-50 MG per tablet Take 1 tablet by mouth at bedtime as needed.

## 2011-12-24 NOTE — Assessment & Plan Note (Signed)
[name] comes her clinic today for followup after his hospitalization for severe community acquired pneumonia with respiratory failure, septic shock and associated empyema. He has done remarkably well since hospital discharge and is essentially asymptomatic today. He has some very mild numbness and itching associated with the surgical scar but apart from that he has no significant respiratory symptoms. My review of his chest x-rays from 11/28/2011 as well as 12/12/2011 show progressive improvement in his imaging. I personally reviewed the CT scan from [facility] and saw no clear mass or other evidence of malignancy. He did have some mediastinal lymphadenopathy noted during that study but this would be expected in the setting of massive infectious burden in the chest.  Plan: -He has had a pneumonia shot in the last 3 years there is no need to update that. -Continue exercise and follow up with primary care physician for treatment of his other comorbidities including diabetes and hypertension. -We will see him back in 3 months with a repeat chest x-ray to ensure resolution after that he can probably follow up with [place] on an as needed as needed basis

## 2012-04-03 ENCOUNTER — Ambulatory Visit: Payer: Self-pay | Admitting: Pulmonary Disease

## 2012-04-07 ENCOUNTER — Ambulatory Visit (INDEPENDENT_AMBULATORY_CARE_PROVIDER_SITE_OTHER): Payer: Medicare Other | Admitting: Pulmonary Disease

## 2012-04-07 ENCOUNTER — Encounter: Payer: Self-pay | Admitting: Pulmonary Disease

## 2012-04-07 VITALS — BP 140/80 | HR 67 | Temp 98.0°F | Ht 72.0 in | Wt 235.0 lb

## 2012-04-07 DIAGNOSIS — J869 Pyothorax without fistula: Secondary | ICD-10-CM

## 2012-04-07 NOTE — Progress Notes (Signed)
  Subjective:    Patient ID: Peter Nunez, male    DOB: 11-13-1940, 71 y.o.   MRN: [id number]  Synopsis: Mr. Rosasco developed an empyema after presumed CAP in June 2013 and was in septic shock around the time of his decortication by Dr. Laneta Simmers at [facility].  He first saw the [facility] Pulmonary office in August 2013 for follow up.  HPI  04/07/2012 ROV -- Mr Bonafede says that he has been doing really well. No cough, fever, chills, or shortness of breath.  He notes that he occasionally notices some food or liquid (coffee) going down the "wrong pipe" causing him to choke.  This happens less than once a month.  In years past he used to have trouble with food getting stuck when swallowing but this has not been a problem lately.    Past Medical History  Diagnosis Date  . HTN (hypertension)   . Diabetes mellitus   . Hyperlipidemia     Review of Systems Not performed    Objective:   Physical Exam  Gen: well appearing, no acute distress HEENT: NCAT, PERRL, EOMi, OP clear, neck supple without masses PULM: CTA B CV: RRR, no mgr, no JVD AB: BS+, soft, nontender, no hsm Ext: warm, no edema, no clubbing, no cyanosis  11/2011 CXR >> small RLL pleural effusion 03/2012 CXR >> small RLL pleural effusion, slightly improved from July 2013, no infiltrate      Assessment & Plan:   Empyema This was related to CAP which developed in June 2013.  Today's CXR shows a small residual pleural scar and thickening which is normal after his surgery.    I think that his RLL pneumonia was due to aspiration.  He reports very rare (less than once a month) aspiration of thin liquids today.  We talked about this at length.  It could be worked up formally with a modified barium swallow, but this is unnecessary.  We discussed that he should take small bites and sips when eating and tuck his chin when swallowing.  If he develops a cough, fever, or shortness of breath he should have a CXR quickly.  If he develops pneumonia I  would treat with augmentin or levaquin to cover possible aspiration.     Updated Medication List Outpatient Encounter Prescriptions as of 04/07/2012  Medication Sig Dispense Refill  . diltiazem (CARDIZEM CD) 120 MG 24 hr capsule Take 1 capsule (120 mg total) by mouth daily.  30 capsule  6  . hydrochlorothiazide (HYDRODIURIL) 25 MG tablet Take 25 mg by mouth daily.      . lansoprazole (PREVACID) 30 MG capsule Take 30 mg by mouth daily.      . metFORMIN (GLUCOPHAGE) 500 MG tablet Take 500 mg by mouth 2 (two) times daily with a meal.      . metoprolol tartrate (LOPRESSOR) 25 MG tablet Take 0.5 tablets (12.5 mg total) by mouth 2 (two) times daily.  30 tablet  6  . niacin (NIASPAN) 500 MG CR tablet Take 500 mg by mouth at bedtime.

## 2012-04-07 NOTE — Patient Instructions (Signed)
Take small bites and sips of food and swallow with your chin tucked.  Go to your doctor's office or call us if you ever develop a fever, cough, or shortness of breath. You can see [place] again if needed.

## 2012-04-07 NOTE — Assessment & Plan Note (Addendum)
This was related to CAP which developed in June 2013.  Today's CXR shows a small residual pleural scar and thickening which is normal after his surgery.    I think that his RLL pneumonia was due to aspiration.  He reports very rare (less than once a month) aspiration of thin liquids today.  We talked about this at length.  It could be worked up formally with a modified barium swallow, but this is unnecessary.  We discussed that he should take small bites and sips when eating and tuck his chin when swallowing.  If he develops a cough, fever, or shortness of breath he should have a CXR quickly.  If he develops pneumonia I would treat with augmentin or levaquin to cover possible aspiration.

## 2012-05-08 ENCOUNTER — Encounter: Payer: Self-pay | Admitting: Pulmonary Disease

## 2013-06-20 IMAGING — US ABDOMEN ULTRASOUND
1 series · 14 of 25 positions shown · non-contrast
Comparison: none

REASON FOR EXAM: RUQ pain
COMMENTS:

[Series 1: abdomen ultrasound · 0.33mm/px · 14 of 50 slices shown]
[im 1/50]
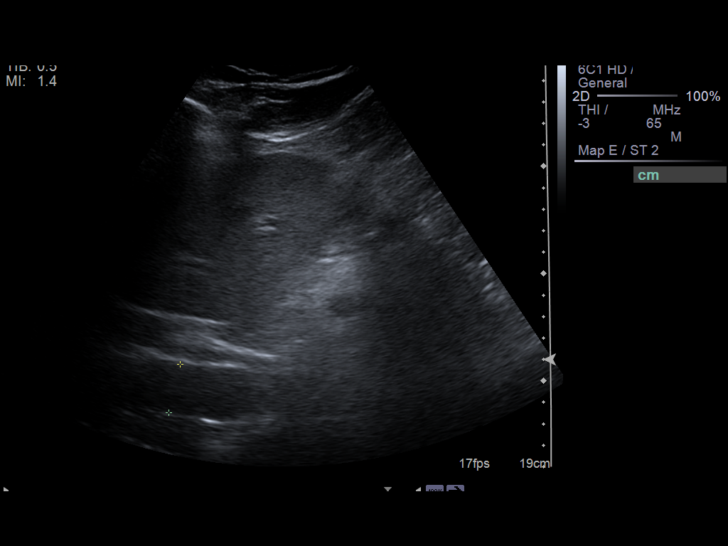
[im 5/50]
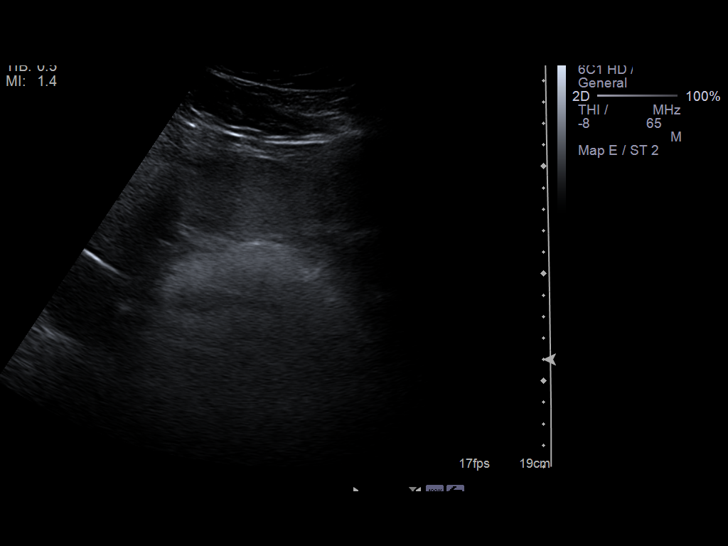
[im 9/50]
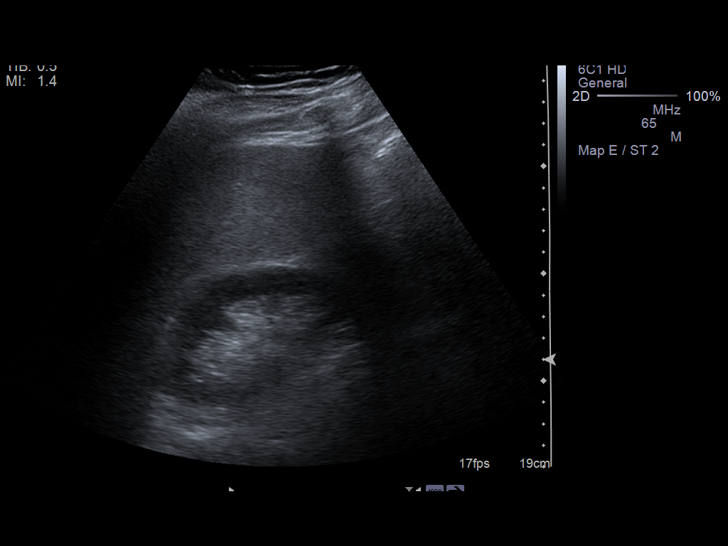
[im 13/50]
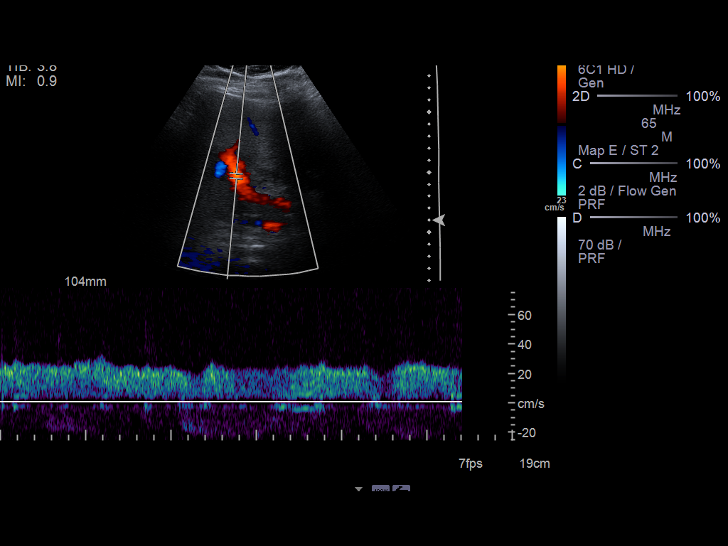
[im 17/50]
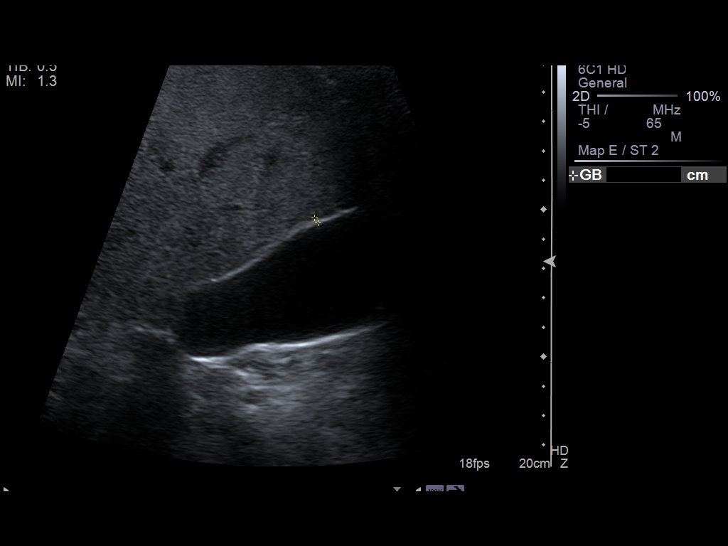
[im 19/50]
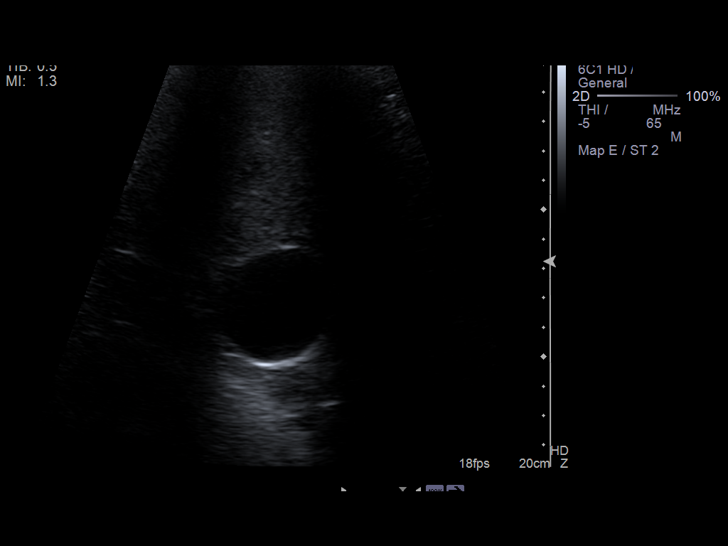
[im 23/50]
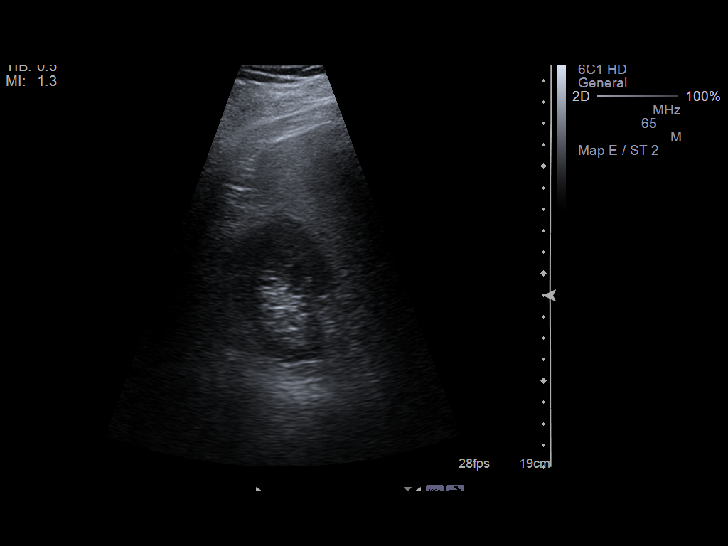
[im 27/50]
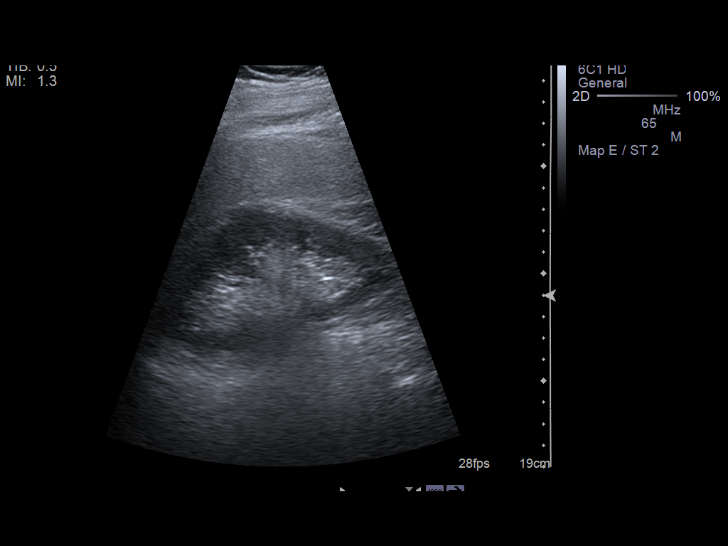
[im 31/50]
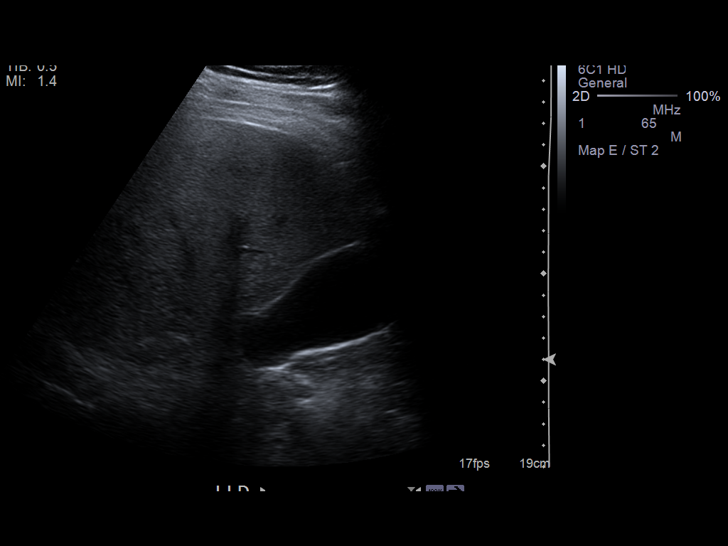
[im 33/50]
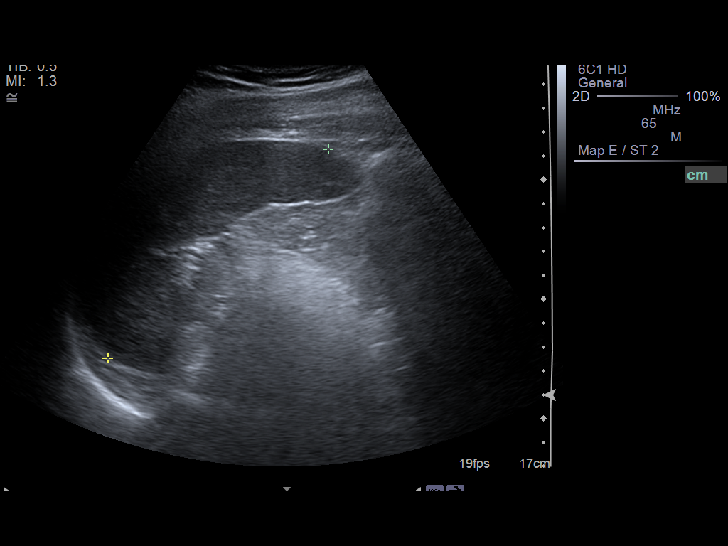
[im 37/50]
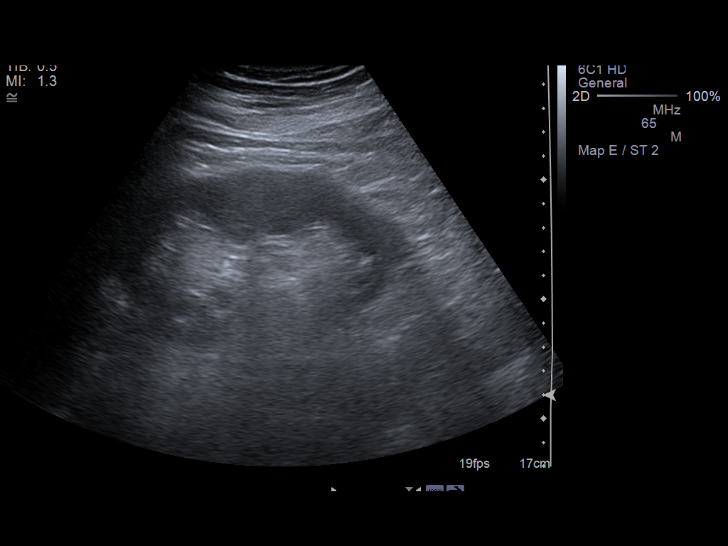
[im 41/50]
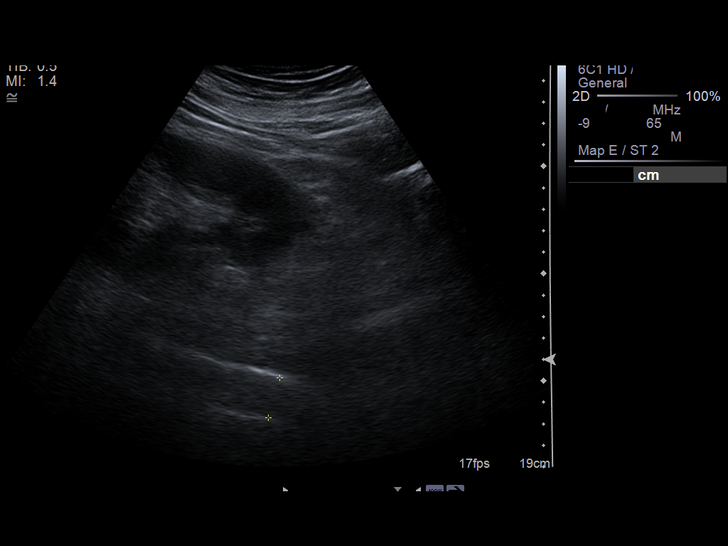
[im 45/50]
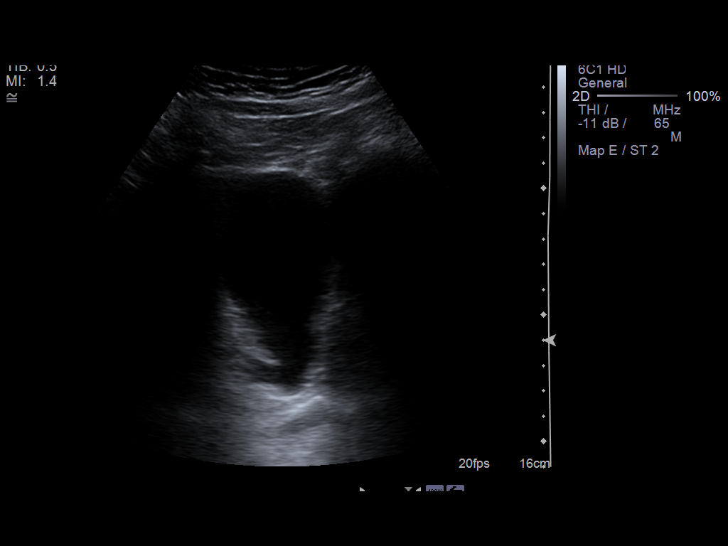
[im 50/50]
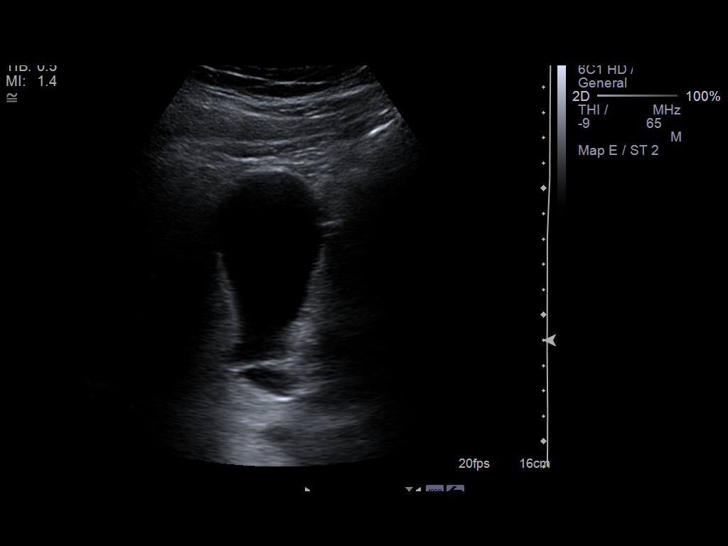

[14 of 25 positions shown; findings below may reference images not displayed]

PROCEDURE:     US  - US ABDOMEN GENERAL SURVEY  - October 21, 2011  [DATE]

RESULT:     The liver, spleen, abdominal aorta and inferior vena cava show
no significant abnormalities. A portion of the pancreatic tail is obscured
by bowel gas but otherwise the pancreas is normal in appearance. No
gallstones are seen. There is no thickening of the gallbladder wall. The
common bile duct measures 4.7 mm in diameter which is within normal limits.
The kidneys show no hydronephrosis. Sagittally, the right kidney measures
11.5 cm and the left measures 12 cm. No ascites is seen.
IMPRESSION: No significant abnormalities are identified.

[REDACTED]

## 2013-06-20 IMAGING — CT CT CHEST-ABD-PELV W/O CM
1 series · 14 of 34 positions shown, 18 images · non-contrast
Comparison: none

REASON FOR EXAM: (1) right sided chest pain; (2) right sided abd pain,
give water soluable, gastr
COMMENTS:

[Series 2: soft tissue · axial · 0.89mm/px · z∈[-725,-110]mm · 14 of 140 slices shown, 18 images]
[im 11/140  mediastinal]
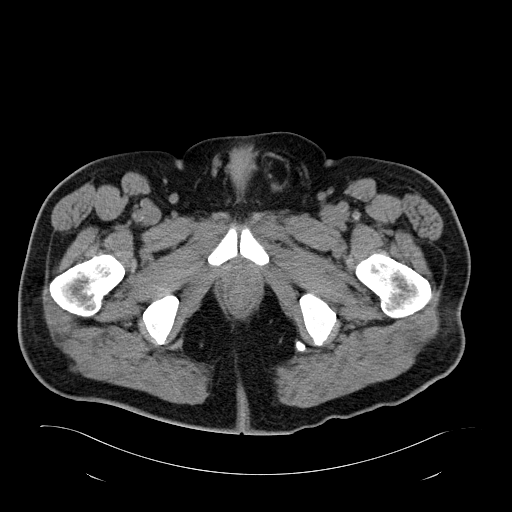
[im 11/140  bone]
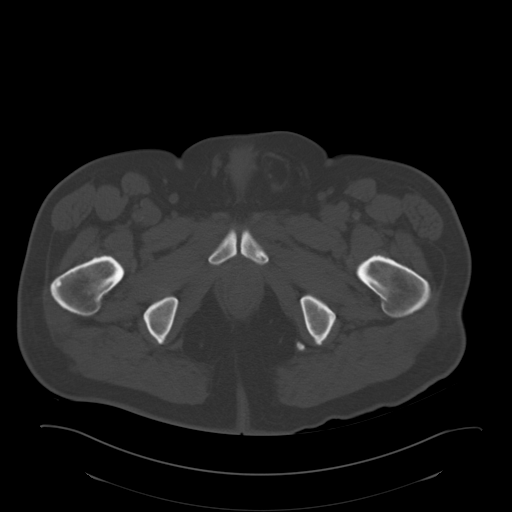
[im 26/140  mediastinal]
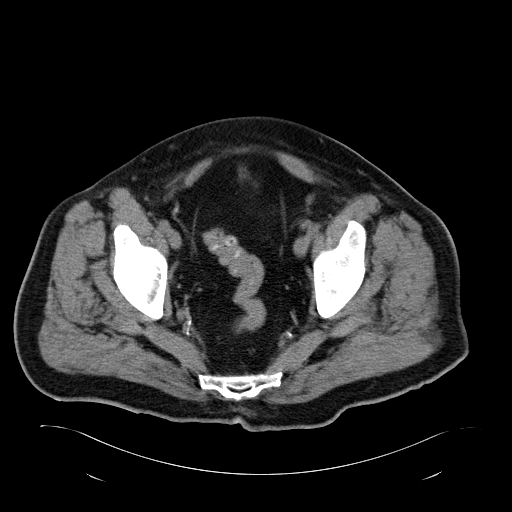
[im 31/140  mediastinal]
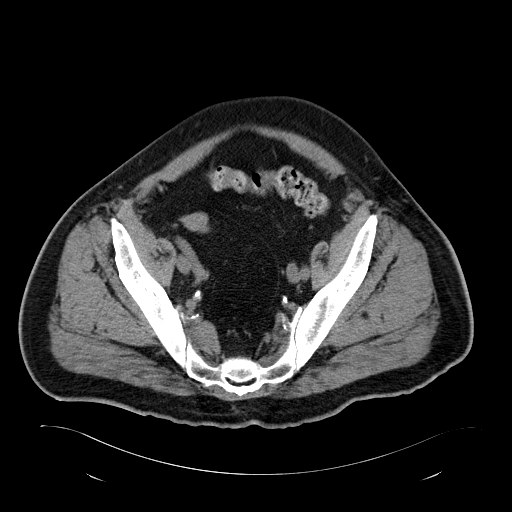
[im 47/140  mediastinal]
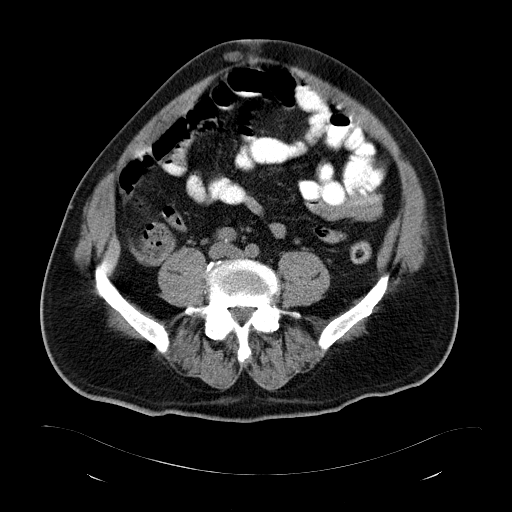
[im 57/140  mediastinal]
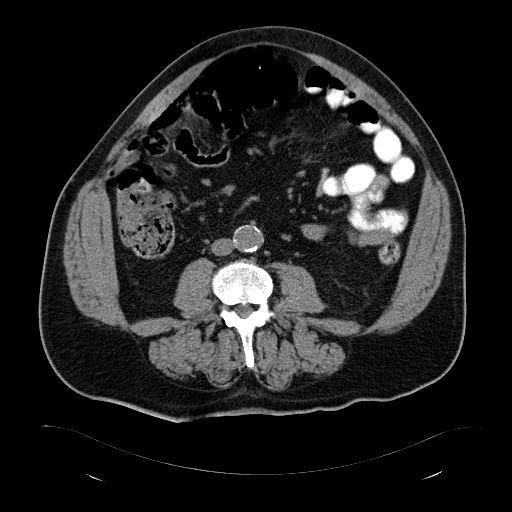
[im 67/140  mediastinal]
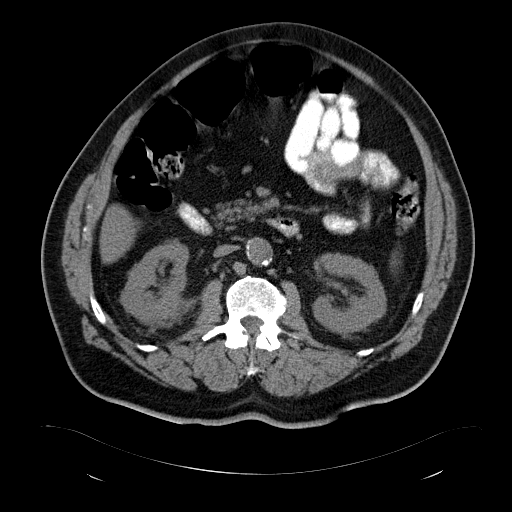
[im 73/140  mediastinal]
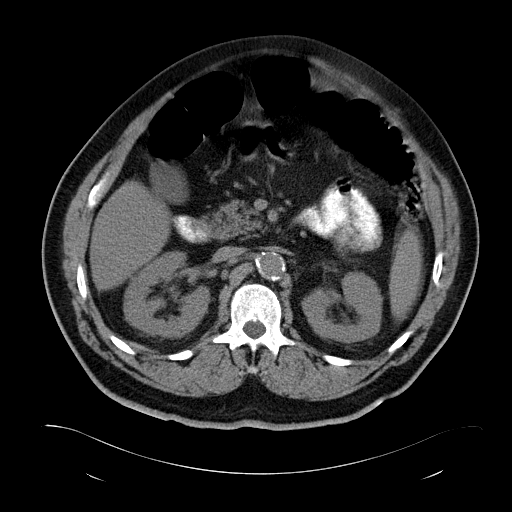
[im 84/140  mediastinal]
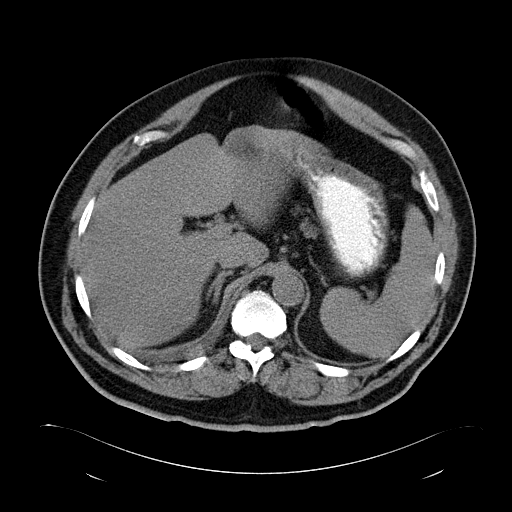
[im 93/140  mediastinal]
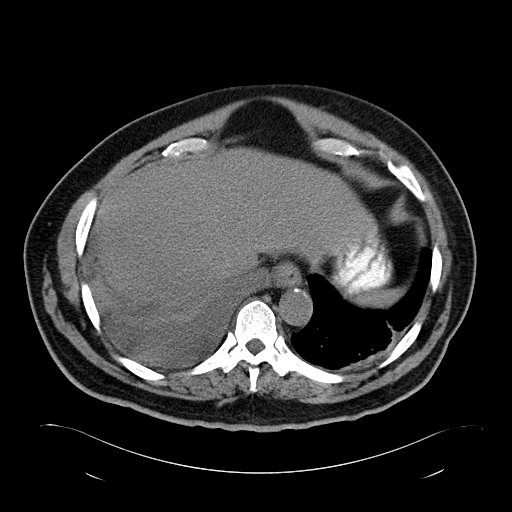
[im 93/140  bone]
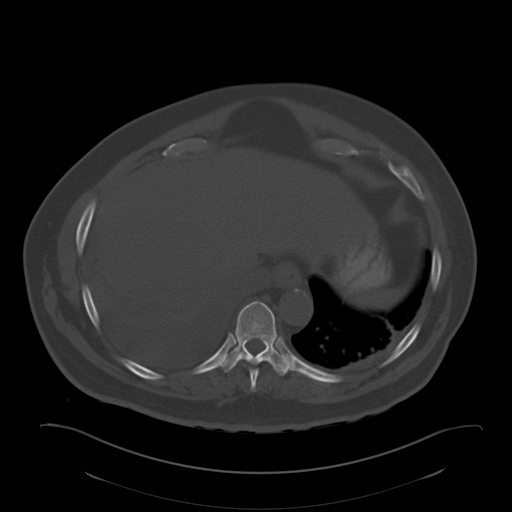
[im 109/140  mediastinal]
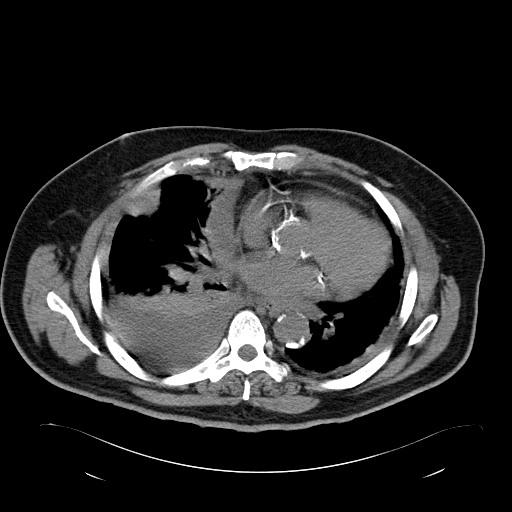
[im 119/140  mediastinal]
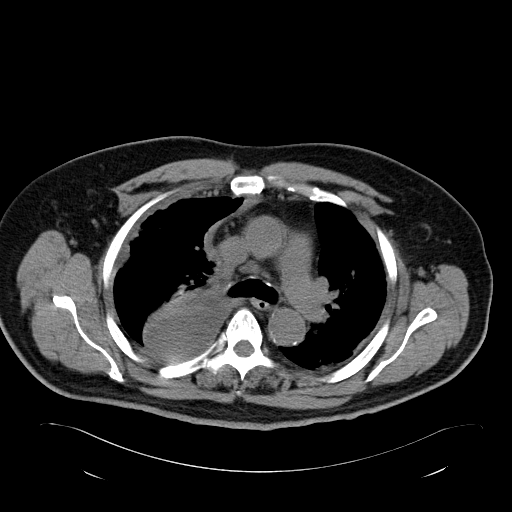
[im 119/140  lung]
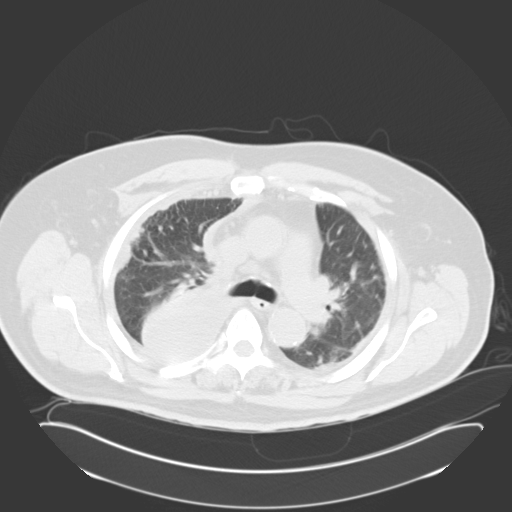
[im 124/140  lung]
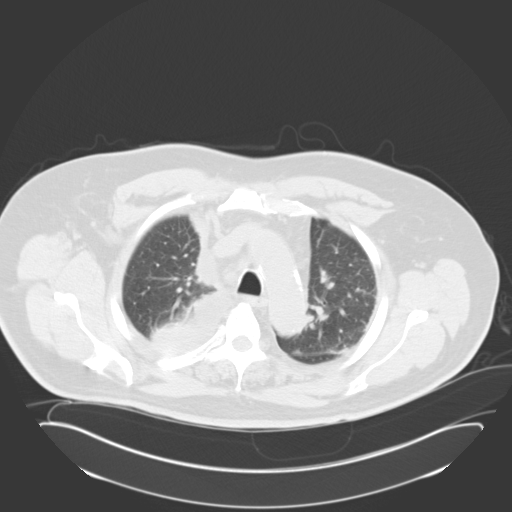
[im 129/140  mediastinal]
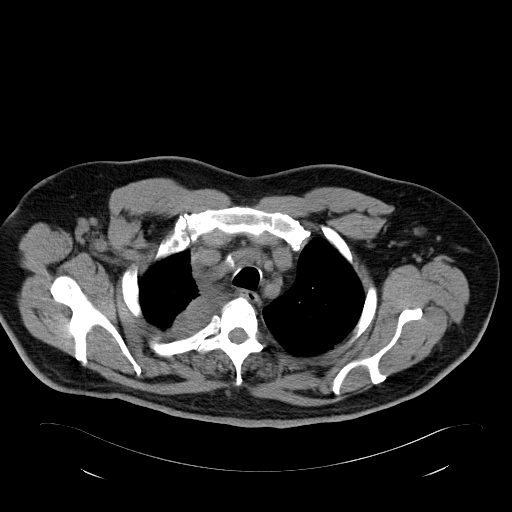
[im 129/140  lung]
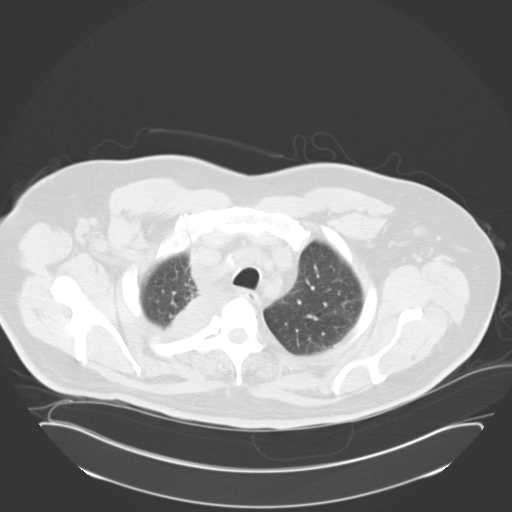
[im 134/140  lung]
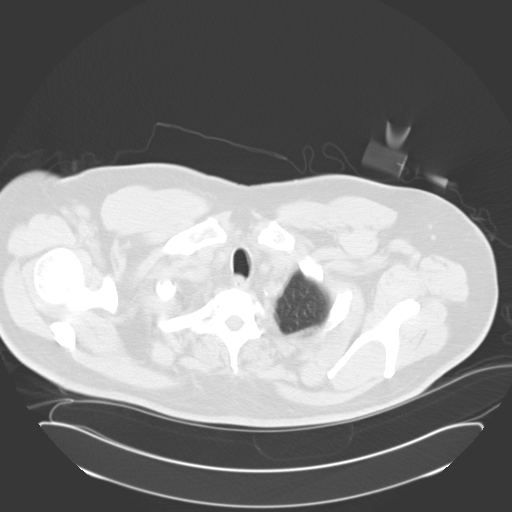

[14 of 34 positions shown; findings below may reference images not displayed]

PROCEDURE:     CT  - CT CHEST ABDOMEN AND PELVIS WO  - October 21, 2011  [DATE]

RESULT:     Axial CT scanning was performed through the chest, abdomen, and
pelvis following intravenous administration of oral contrast only. Review of
multiplanar reconstructed images was performed separately on the VIA
monitor. The patient has decreased renal function.

CT scan of the chest: There is a moderate-sized right pleural effusion.
There is consolidation of portions of the right lower lobe and inferior
aspect of the right upper lobe. Pleural based densities are demonstrated
especially in the right upper lobe. On the left there is minimal pleural
thickening posteriorly. The cardiac chambers are top normal in size. I see
no significant pericardial effusion. I see no suspicious appearing pulmonary
nodules but confluent density in the right lung could certainly exterior
underlying parenchymal masses. Within the mediastinum there are enlarged
lymph nodes. Adjacent to the proximal aspect of the carina on image 16 a
lymph node measures 2.2 cm in diameter. Just inferior to this more in the
midline a precarinal lymph node measures 1.6 cm in diameter. There are a few
normal sized to borderline enlarged left hilar lymph nodes. I see no
axillary lymphadenopathy.
CONCLUSION: There are pleural based masses in the right lower lobe and right
upper lobe with a moderate-sized right pleural effusion. There are also
enlarged mediastinal lymph nodes. These findings could certainly reflect
malignancy though an infectious process is not excluded. Minimal pleural
thickening on the left posteriorly is present.

CT scan of the abdomen and pelvis: The liver, gallbladder, spleen, pancreas,
partially distended stomach, adrenal glands, and kidneys exhibit no acute
abnormality. I see no evidence of urinary tract stones nor obstruction. The
abdominal aorta exhibits mild failure to taper of its caliber, but there is
no evidence of an aneurysm. I see no periaortic nor pericaval
lymphadenopathy.

The orally administered contrast has traversed much of the small bowel but
has not yet reached the colon. I see no evidence of ileus nor of
obstruction. There a few diverticula in the sigmoid colon. There are small
inguinal hernias containing only fat. The urinary bladder and prostate gland
and seminal vesicles exhibit no acute abnormality. The thoracic and lumbar
vertebral bodies are preserved in height.
IMPRESSION: 1. Please see the discussion above regarding findings in the thorax.
2. Within the abdomen and pelvis I do not see evidence of acute abnormality
within the limits of a noncontrast exam.

Oncologic evaluation and PET/CT scanning would be useful.

## 2013-06-22 IMAGING — CR DG ABD PORTABLE 1V
1 series · 1 of 1 positions shown · non-contrast
Comparison: Portable exam 8788 hours without priors for comparison

CLINICAL DATA: Abdominal pain and distention question bowel
obstruction

PORTABLE ABDOMEN - 1 VIEW

[AP]
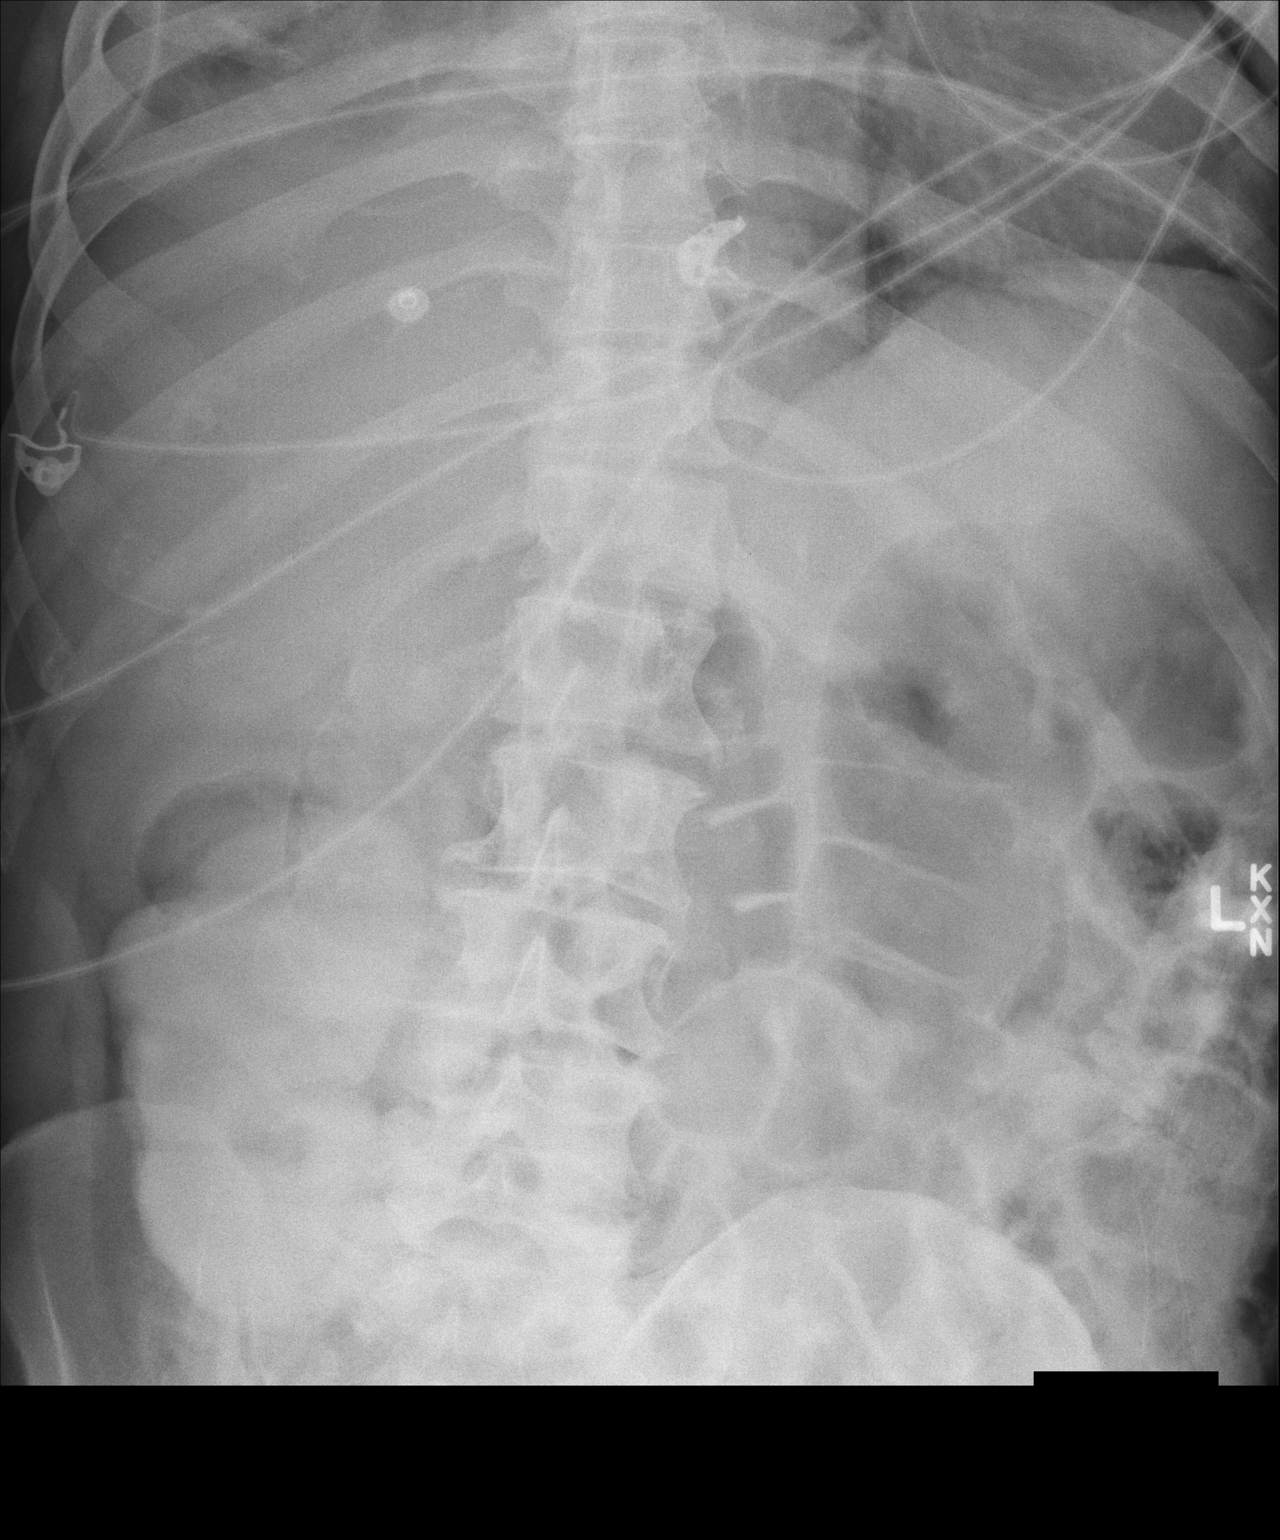

[1 of 1 positions shown; findings below may reference images not displayed]

FINDINGS: Scattered contrast, air and minimal stool within colon.
No definite bowel dilatation identified.
No bowel wall thickening.
Small bowel loops appear decompressed.
Pelvis excluded.
Elevation of right diaphragm.
No acute osseous findings.
IMPRESSION: No definite evidence of bowel obstruction.

## 2013-06-24 IMAGING — CR DG CHEST 1V PORT
1 series · 1 of 1 positions shown · non-contrast
Comparison: 10/24/2011.

CLINICAL DATA: Status post VATS.

PORTABLE CHEST - 1 VIEW

[AP]
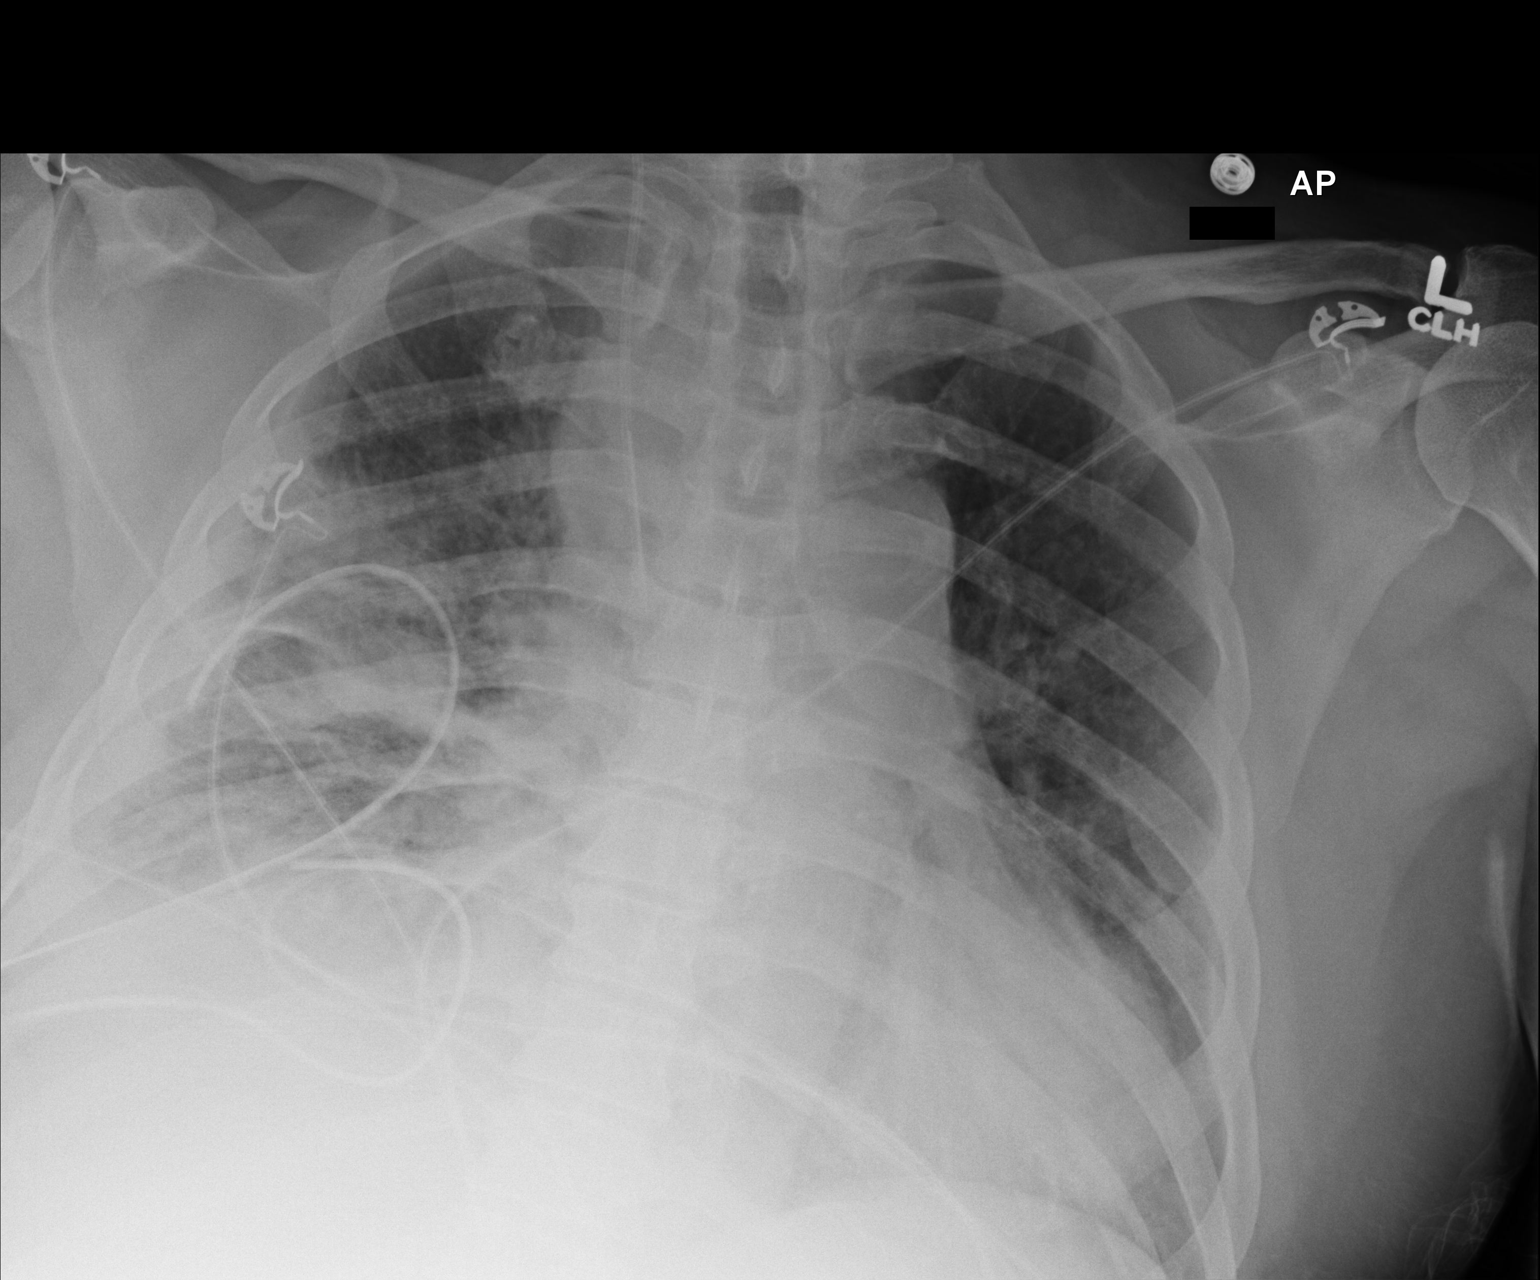

[1 of 1 positions shown; findings below may reference images not displayed]

FINDINGS: The endotracheal tube and NG tubes have been removed.
The right IJ catheter is stable.  Stable pleural drainage catheters
on the right.  Slight improved lung aeration.  Persistent areas of
atelectasis.
IMPRESSION: 1.  Removal of ET and NG tubes.
2.  Persistent areas of atelectasis but slight overall improved
aeration.

## 2013-07-01 ENCOUNTER — Emergency Department: Payer: Self-pay | Admitting: Internal Medicine

## 2013-07-01 LAB — COMPREHENSIVE METABOLIC PANEL
ALK PHOS: 79 U/L
Albumin: 4.1 g/dL (ref 3.4–5.0)
Anion Gap: 8 (ref 7–16)
BUN: 15 mg/dL (ref 7–18)
Bilirubin,Total: 0.5 mg/dL (ref 0.2–1.0)
Calcium, Total: 9.7 mg/dL (ref 8.5–10.1)
Chloride: 101 mmol/L (ref 98–107)
Co2: 27 mmol/L (ref 21–32)
Creatinine: 0.93 mg/dL (ref 0.60–1.30)
EGFR (African American): >60
EGFR (Non-African Amer.): >60
Glucose: 193 mg/dL — ABNORMAL HIGH (ref 65–99)
Osmolality: 278 (ref 275–301)
Potassium: 3.9 mmol/L (ref 3.5–5.1)
SGOT(AST): 14 U/L — ABNORMAL LOW (ref 15–37)
SGPT (ALT): 30 U/L (ref 12–78)
Sodium: 136 mmol/L (ref 136–145)
TOTAL PROTEIN: 7.4 g/dL (ref 6.4–8.2)

## 2013-07-01 LAB — URINALYSIS, COMPLETE
BACTERIA: NONE SEEN
BLOOD: NEGATIVE
Bilirubin,UR: NEGATIVE
GLUCOSE, UR: NEGATIVE mg/dL (ref 0–75)
KETONE: NEGATIVE
LEUKOCYTE ESTERASE: NEGATIVE
NITRITE: NEGATIVE
Ph: 6 (ref 4.5–8.0)
Protein: 100
Specific Gravity: 1.015 (ref 1.003–1.030)
Squamous Epithelial: <1

## 2013-07-01 LAB — CBC
HCT: 43.1 % (ref 40.0–52.0)
HGB: 14.5 g/dL (ref 13.0–18.0)
MCH: 29.1 pg (ref 26.0–34.0)
MCHC: 33.7 g/dL (ref 32.0–36.0)
MCV: 86 fL (ref 80–100)
Platelet: 163 10*3/uL (ref 150–440)
RBC: 4.98 10*6/uL (ref 4.40–5.90)
RDW: 12.9 % (ref 11.5–14.5)
WBC: 10.4 10*3/uL (ref 3.8–10.6)

## 2013-07-01 LAB — TROPONIN I: Troponin-I: <0.02 ng/mL

## 2013-07-01 LAB — PROTIME-INR
INR: 0.9
PROTHROMBIN TIME: 12.2 s (ref 11.5–14.7)

## 2013-07-01 LAB — CK TOTAL AND CKMB (NOT AT ARMC)
CK, Total: 41 U/L
CK-MB: 1.8 ng/mL (ref 0.5–3.6)

## 2014-09-11 NOTE — H&P (Signed)
PATIENT NAME:  Peter Nunez, Peter Nunez MR#:  [id number] DATE OF BIRTH:  06-Sep-1940  DATE OF ADMISSION:  10/20/2011  PRIMARY CARE PHYSICIAN: Dr. Bernie Covey   CHIEF COMPLAINT: Cough and chest pain and fever.   HISTORY OF PRESENT ILLNESS: Mr. Nettleton is a 74 year old Caucasian male who was in his usual state of health until about Thursday, that is three days ago, when he developed a dry cough, which progressed and in the last couple of days it got worse, associated with fever reaching 106 and chills. The patient started to develop pain in the right lower chest area upon coughing. He decided to come then to the Emergency Room for evaluation. A chest x-ray here revealed pneumonia. The patient was admitted for further evaluation and treatment of his pneumonia.   REVIEW OF SYSTEMS:  CONSTITUTIONAL: The patient reports fever and chills. Mild fatigue. EYES: No blurring of vision. No double vision. ENT: No hearing impairment. No sore throat. No dysphagia. CARDIOVASCULAR: Reports right lower chest pain upon coughing. Mild shortness of breath. No edema. No syncope. RESPIRATORY: Reports dry cough, chest pain upon coughing, and mild shortness of breath. GASTROINTESTINAL: No abdominal pain. No vomiting. No diarrhea. GENITOURINARY: No dysuria. No frequency of urination. MUSCULOSKELETAL: No joint pain or swelling. No muscular pain or swelling. No calf tenderness or swelling. INTEGUMENT: No skin rash. No ulcers. NEUROLOGY: No focal weakness. No seizure activity. No headache. PSYCH: No anxiety. No depression. ENDOCRINE: No polyuria or polydipsia. No heat or cold intolerance.   PAST MEDICAL HISTORY:  1. Systemic hypertension.  2. Diabetes mellitus type 2. 3. Hyperlipidemia.   PAST SURGICAL HISTORY: In 2010 he had a laparoscopic transverse colectomy for polyp removal.   ADMISSION MEDICATIONS:  1. Niacin 500 mg once a day. 2. Metformin 500 mg twice a day. 3. Lisinopril 40 mg a day.  4. Diuretic, reports  hydrochlorothiazide, does not know the dose, once a day. 5. Recently in the last couple of weeks he was also placed on another blood pressure pill. He does not recall the name.   6. Fish oil.   FAMILY HISTORY: His father died at the age of 35 from chronic alcoholism. His mother died at the age of 74 from old age.   SOCIAL HABITS: Nonsmoker. No history of alcohol abuse.   SOCIAL HISTORY: He is widowed, right now he is retired from working at a [employer].   ALLERGIES: No known drug allergies, but he is allergic to bee stings.   PHYSICAL EXAMINATION:  VITAL SIGNS: Blood pressure 103/47, respiratory rate 20, pulse 112, temperature 99.6, oxygen saturation 94%.   GENERAL APPEARANCE: Elderly male laying in bed in no acute distress.   HEAD: No pallor. No icterus. No cyanosis.   ENT: Hearing was normal. Nasal mucosa, lips, tongue were normal.   EYES: Normal eyelids and conjunctivae. Pupils are about 5 mm, equal and sluggishly reactive to light.   NECK: Supple. Trachea at midline. No thyromegaly. No cervical lymphadenopathy. No masses.   HEART: Normal S1, S2. No S3, S4. No murmur. No gallop. No carotid bruits.   RESPIRATORY: Slight tachypnea. No rales. No wheezing. There are decreased breath sounds at the right base. No friction rub.   ABDOMEN: No hepatosplenomegaly. No masses. No hernias.   SKIN: No ulcers. No subcutaneous nodules.   MUSCULOSKELETAL: No joint swelling. No clubbing.   NEUROLOGIC: Cranial nerves II through XII are intact. No focal motor deficit.   PSYCH: The patient is alert and oriented times three. Mood and affect  were normal.   LABORATORY, DIAGNOSTIC, AND RADIOLOGICAL DATA: Chest x-ray showed right lower lung field consolidation consistent with pneumonia. There is also a silhouette of the left diaphragm. I cannot rule out infiltrates in that area as well. EKG showed sinus tachycardia at a rate of 118 per minute. Old anterior myocardial infarction. Otherwise  unremarkable EKG. Serum glucose 303, b-type natriuretic peptide 181, BUN 27, creatinine 1.6, sodium 134, potassium 3.7. Estimated GFR was 42. Liver function tests were normal except for slightly low albumin at 3.1. CPK 51. Troponin less than 0.02. CBC showed white count of 17,000, hemoglobin 12, hematocrit 37, platelet count 351. Urinalysis was unremarkable except for more than 500 of glucose in the urine and 30 mg of protein.   ASSESSMENT:  1. Pneumonia.  2. Pleurisy from the right-sided pneumonia.  3. Systemic hypertension.  4. Diabetes mellitus type 2, uncontrolled.   5. Hyperlipidemia.  6. History of coronary artery disease.   PLAN: Admit to the medical floor. Blood cultures were taken. IV antibiotics started using Rocephin along with Zithromax. Oxygen supplementation.  Continue home medications as listed above. Insulin sliding scale. Deep vein thrombosis prophylaxis with Lovenox since the patient is less ambulatory now, being sick. The patient reports to me that he does have a LIVING WILL and he also appointed his daughter to have the power of attorney to make decisions.   TIME SPENT EVALUATING THIS PATIENT: More than 55 minutes.    ____________________________ Clovis Pu. Lenore Manner, MD amd:bjt D:  10/20/2011 05:07:58 ET         T: 10/20/2011 10:23:19 ET         JOB#: [id number]  cc:  Valetta Close, MD Mike Craze Irven Coe MD ELECTRONICALLY SIGNED 10/21/2011 6:33

## 2014-09-11 NOTE — Consult Note (Signed)
General Aspect 74 year old Caucasian male with hx of CAD, previous PCI to the RCA in 2010,  who was in his usual state of health until about Thursday 10/17/11, when he developed a dry cough, which progressed, also developing feversreaching 106 and chills.  A chest x-ray here revealed pneumonia. Ct showing pleural effusion, pleural based masses in right upper and lower lobes. Hypoxia and atrial fibrillation last night, transferred to CCU overnight. cardiology was consulted for atrial fibrillation.  this AM, reports significant right flank pain, SOB. Currently receiving breathing treatment. Per pulmonary note, CT surgery services not available at this time and patient will be transferred. He is maintaining NSR (sinus tachycardia). Underlying renal dysfunction, creatinine >2.  Fio2 remains at 55%.  patient with increased WOB and using accessory muscles to breathe this AM.    Present Illness . FAMILY HISTORY: His father died at the age of 45 from chronic alcoholism. His mother died at the age of 2 from old age.   SOCIAL HABITS: Nonsmoker. No history of alcohol abuse.   SOCIAL HISTORY: He is widowed, right now he is retired from working at a [employer].   Physical Exam:   GEN well developed, well nourished, no acute distress, obese    HEENT red conjunctivae    NECK supple  No masses    RESP postive use of accessory muscles  decreased BS at right base 1/2 up    CARD Tachycardic  No murmur    ABD denies tenderness  rigid  "always hard since my surgery"    LYMPH negative neck    EXTR negative edema    SKIN normal to palpation    NEURO motor/sensory function intact    PSYCH alert, A+O to time, place, person, good insight   Review of Systems:   Subjective/Chief Complaint SOB, right flank pain    General: Trouble sleeping    Skin: No Complaints    ENT: No Complaints    Eyes: No Complaints    Neck: No Complaints    Respiratory: Frequent cough  Short of breath     Cardiovascular: Dyspnea    Gastrointestinal: No Complaints    Genitourinary: No Complaints    Vascular: No Complaints    Neurologic: No Complaints    Hematologic: No Complaints    Endocrine: No Complaints    Psychiatric: No Complaints    Review of Systems: All other systems were reviewed and found to be negative    Medications/Allergies Reviewed Medications/Allergies reviewed     Diabetes:    hypertension:    cardiac stents: 2010   Colon Resection:        Admit Reason:   Pneumonia: (486) 20-Oct-2011, Active, ICD9, PNEUMONIA, ORGANISM NOS  Home Medications: Medication Instructions Status  amlodipine 5 mg oral tablet 1 tab(s) orally once a day Active  lansoprazole 30 mg oral delayed release capsule 1 cap(s) orally once a day Active  lisinopril 40 mg oral tablet 1 tab(s) orally 2 times a day Active  hydrochlorothiazide 25 mg oral tablet 1 tab(s) orally once a day Active  metformin 500 mg oral tablet 1 tab(s) orally 2 times a day Active  Probiotic Formula oral capsule 1 cap(s) orally once a day Active   Lab Results:  Routine Chem:  04-Jun-13 04:15    Glucose, Serum  246   BUN  51   Creatinine (comp)  2.27   Sodium, Serum  134   Potassium, Serum 4.3   Chloride, Serum 99   CO2, Serum  23   Calcium (Total), Serum  8.4   Anion Gap 12   Osmolality (calc) 290   eGFR (African American)  33   eGFR (Non-African American)  29 (eGFR values <65m/min/1.73 m2 may be an indication of chronic kidney disease (CKD). Calculated eGFR is useful in patients with stable renal function. The eGFR calculation will not be reliable in acutely ill patients when serum creatinine is changing rapidly. It is not useful in  patients on dialysis. The eGFR calculation may not be applicable to patients at the low and high extremes of body sizes, pregnant women, and vegetarians.)   Magnesium, Serum  1.7 (1.8-2.4 THERAPEUTIC RANGE: 4-7 mg/dL TOXIC: > 10 mg/dL  -----------------------)   Routine Hem:  04-Jun-13 04:15    WBC (CBC)  23.4   RBC (CBC)  3.88   Hemoglobin (CBC)  11.0   Hematocrit (CBC)  34.3   Platelet Count (CBC) 324   MCV 88   MCH 28.4   MCHC 32.1   RDW 12.9   Neutrophil % 86.8   Lymphocyte % 4.2   Monocyte % 8.4   Eosinophil % 0.4   Basophil % 0.2   Neutrophil #  20.3   Lymphocyte # 1.0   Monocyte #  2.0   Eosinophil # 0.1   Basophil # 0.1 (Result(s) reported on 22 Oct 2011 at 05:39AM.)   EKG:   Interpretation EKG shows atrial fibrillation with rate 159 bpm, nonspecific  ST abn (10 pm) TELE currently showing sinus tachycardia with rate 104 bpm.    Bee Stings: Swelling  Vital Signs/Nurse's Notes: **Vital Signs.:   04-Jun-13 08:00   Temperature Temperature (F) 98.2   Celsius 36.7   Temperature Source oral   Pulse Pulse 112   Respirations Respirations 29   Systolic BP Systolic BP 1106  Diastolic BP (mmHg) Diastolic BP (mmHg) 60   Mean BP 78   Pulse Ox % Pulse Ox % 94   Pulse Ox Heart Rate 112     Impression 74year old Caucasian male with hx of CAD, previous PCI to the RCA in 2010,  who was in his usual state of health until about Thursday 10/17/11, when he developed a dry cough, which progressed, also developing fevers reaching 106 and chills.  A chest x-ray suggesting pneumonia, CT chest showing pleural effusion, pleural based masses in right upper and lower lobes. Hypoxia and atrial fibrillation last night, transferred to CCU overnight. cardiology was consulted for atrial fibrillation.  1) Atrial fib: No in NSR. Unable to give rhythm control medications last night secondary to hypotension. -Rhythm change likely secondary to underlying sepsis and hypoxia. --If he converts back to atrial fibrillation, would initiate amiodarone bolus (slowly if needed over 30 minutes if BP borderline), then start infusion.  --If he maintains NSR, could monitor closely for now. high risk of converting to atrial fib periop if Chest tube required.   2)  Respiratory distress/pleural effusion Pneumonia, possible empyema Pulmonary has recommended chest tube Currently on broad ABx  3) CAd: Previous PCI No recent anginal sx, cardiac enz negative no recent procedures Unable to add b-blocker secondary to hypotension Would hold on aspirin given possible procedure (chest tube)  4) s/p Colon resection ABD "always hard" since surgery  5)HTN: Holding BP meds.   Electronic Signatures: GIda Rogue(MD)  (Signed 04-Jun-13 10:19)  Authored: General Aspect/Present Illness, History and Physical Exam, Review of System, Past Medical History, Health Issues, Home Medications, Labs, EKG , Allergies, Vital Signs/Nurse's Notes, Impression/Plan  Last Updated: 04-Jun-13 10:19 by Ida Rogue (MD)

## 2014-09-11 NOTE — Discharge Summary (Signed)
PATIENT NAME:  Peter, Nunez MR#:  [id number] DATE OF BIRTH:  1941/01/11  DATE OF ADMISSION:  10/20/2011 DATE OF DISCHARGE:  10/23/2011  DIAGNOSES:  1. Sepsis with fever, tachycardia, leukocytosis and acute renal failure.  2. Possible necrotizing pneumonia with complicated parapneumonic effusion/empyema.  3. Diabetes.  4. Hypertension.  5. Acute renal failure. 6. Leukocytosis. 7. Normocytic anemia.  8. Atrial fibrillation, now in sinus. 9. Hypomagnesemia.   DISPOSITION: The patient was transferred to [facility].   DIET: 1,800 calorie, 2 grams sodium.   DISCHARGE MEDICATIONS:  1. DuoNebs every six hours p.r.n. 2. Budesonide 0.5 mg SVN b.i.d.  3. Zofran 4 mg IV q.6 hours p.r.n.  4. Lantus 60 units subcutaneously at bedtime.  5. Aspirin 81 mg daily.  6. Vancomycin 0.250 milligrams every 24 hours. 7. Lopressor 25 mg b.i.d.  8. Dilaudid 1 to 2 mg IV q.3 hours p.r.n. pain. 9. Fentanyl patch 50 mcg q.3h.  10. Zithromax 500 mg IV daily.  11. Phenergan 12.5 mg IV push every six hours p.r.n. 12. Zosyn 3.375 grams IV every eight hours. 13. Glipizide 10 mg b.i.d.  14. Zantac 150 mg at bedtime.  15. Niacin 500 mg daily. 16. Insulin sliding scale.  17. Lovenox 40 mg subcutaneously daily.  18. Robitussin-DM 10 milliliters every six hours p.r.n. cough.  19. Tussionex 5 milliliters b.i.d. p.r.n. cough.  [place], one to two tablets every four hours p.r.n.  21. Tylenol 650 mg every four hours p.r.n.   CONSULTATIONS:  1. Pulmonary critical care consultation with Dr. Mortimer Fries.  2. Nephrology consultation with Dr. Candiss Norse.  3. Surgical consultation with Dr. Leanora Cover. 4. Cardiology consultation with Dr. Rockey Situ.  LABORATORY, DIAGNOSTIC, AND RADIOLOGICAL DATA: Abdominal ultrasound showed no acute abnormality. CT of chest, abdomen and pelvis showed moderate size right pleural effusion, consolidation of portions of the right lower lobe and inferior aspect of the right upper lobe. Fluid  densities are demonstrated in the right upper lobe. On the left there is minimal pleural thickening. No suspicious appearing pulmonary nodules. Enlarged mediastinal and hilar lymph nodes. CT of the abdomen showed no acute abnormality. Microbiology: Blood cultures no growth so far. White count is 17.2 on admission, went up to as high as 23.5, hemoglobin 12.5 to 10.3. Normal platelet count. BNP 131, glucose 303. Lipase normal. Creatinine 1.64 on admission, 2.40 by the time of discharge. Sodium ranging from 134 to 132. Cardiac enzymes negative.   HOSPITAL COURSE: The patient is a 74 year old male with past medical history of diabetes,  hypertension and hyperlipidemia who presented with cough, fever and right-sided chest pain. He was found to have sepsis with fever, tachycardia and leukocytosis. He had mild renal insufficiency on presentation and subsequently went into acute renal failure. A chest x-ray showed possible right-sided pneumonia. He was initially empirically treated for community-acquired pneumonia; however, his condition did not improve. He had severe excruciating pain on the right side of his chest. Therefore, a CT scan of the chest was done which showed a moderate sized right pleural effusion, fluid based masses, hilar and mediastinal lymphadenopathy and right-sided consolidation. He likely had necrotizing pneumonia with empyema/complicated parapneumonic effusion. The patient's family reported that he had on and off cough for several months with intermittent treatment with antibiotics and recurrence of symptoms. He smoked 1 to 2 packs of cigarettes or more for 20 to 30 years, but quit about 20 to 25 years ago. There was a possibility of cancer, but the patient's clinical presentation was more consistent with  sepsis, infection, and empyema. Blood cultures were sent which have been negative so far. He was started on p.r.n. antitussives, incentive spirometry, scheduled nebulizers. His antibiotics were  changed to vancomycin and Zosyn based on creatinine clearance. Surgical and pulmonary consultations were obtained. The patient did not have a safe window for placement of a chest tube as per the pulmonologist. The patient would likely require decortication and he recommended transfer to a tertiary care center. The patient has uncontrolled diabetes. His metformin was placed on hold due to acute renal failure. He was treated with glipizide and addition of Lantus. On presentation, his blood pressure was low. During the hospitalization he did develop some additional hypertension. He was treated with beta blocker with holding parameters. The patient had some renal insufficiency too on admission. His HCTZ and ACE inhibitor were held. The patient developed acute renal failure likely due to ATN from sepsis and hypotension. He was treated with IV fluids. Nephrology consultation was obtained who recommended conservative management for the time being. The patient's leukocytosis increased during the hospitalization but thereafter remained stable. He had anemia of chronic disease which became compounded by hemodilution and recurrent blood draws. The patient's hospital course was also complicated by development of atrial fibrillation likely due to sepsis. The patient spontaneously converted to normal sinus rhythm. He was started on beta blocker with holding parameters and aspirin. Cardiology consultation was obtained and the cardiologist recommended conservative management. Several tertiary care facilities were contacted. The patient had a bed available at [facility] where he was transferred in stable condition.   TIME SPENT: 45 minutes.    ____________________________ Cherre Huger, MD sp:ap D: 10/23/2011 15:33:58 ET T: 10/24/2011 11:21:01 ET JOB#: [id number]  cc: Cherre Huger, MD, <Dictator> Cherre Huger MD ELECTRONICALLY SIGNED 11/02/2011 7:25

## 2014-09-11 NOTE — Consult Note (Signed)
Brief Consult Note: Diagnosis: R empyema with pleural-based masses.   Patient was seen by consultant.   Comments: I reviewed CT and unfortunately there is not a safe window for a C/T to be inserted. Most of effusion is medial to lung and quite low and there is marked elevation of R hemidiaphragm with the liver very close.  An U/S-guided C/T could possibly be inserted either in the OR tomorrow by Dr Marina Gravel or by Interventional Radiology.  Sorry.  Electronic Signatures: Consuela Mimes (MD)  (Signed 04-Jun-13 23:41)  Authored: Brief Consult Note   Last Updated: 04-Jun-13 23:41 by Consuela Mimes (MD)

## 2015-03-22 ENCOUNTER — Encounter: Payer: Self-pay | Admitting: Emergency Medicine

## 2015-03-22 ENCOUNTER — Emergency Department
Admission: EM | Admit: 2015-03-22 | Discharge: 2015-03-22 | Disposition: A | Discharge status: Home / Self Care | Payer: Medicare Other | Attending: Emergency Medicine | Admitting: Emergency Medicine

## 2015-03-22 ENCOUNTER — Ambulatory Visit
Admission: EM | Admit: 2015-03-22 | Discharge: 2015-03-22 | Disposition: A | Discharge status: Home / Self Care | Payer: Medicare Other | Source: Home / Self Care

## 2015-03-22 ENCOUNTER — Emergency Department: Payer: Medicare Other

## 2015-03-22 DIAGNOSIS — I10 Essential (primary) hypertension: Secondary | ICD-10-CM | POA: Diagnosis not present

## 2015-03-22 DIAGNOSIS — Z7902 Long term (current) use of antithrombotics/antiplatelets: Secondary | ICD-10-CM | POA: Insufficient documentation

## 2015-03-22 DIAGNOSIS — E119 Type 2 diabetes mellitus without complications: Secondary | ICD-10-CM | POA: Diagnosis not present

## 2015-03-22 DIAGNOSIS — Z7984 Long term (current) use of oral hypoglycemic drugs: Secondary | ICD-10-CM | POA: Diagnosis not present

## 2015-03-22 DIAGNOSIS — L03115 Cellulitis of right lower limb: Secondary | ICD-10-CM

## 2015-03-22 DIAGNOSIS — Z79899 Other long term (current) drug therapy: Secondary | ICD-10-CM | POA: Insufficient documentation

## 2015-03-22 DIAGNOSIS — Z87891 Personal history of nicotine dependence: Secondary | ICD-10-CM | POA: Diagnosis not present

## 2015-03-22 DIAGNOSIS — R2241 Localized swelling, mass and lump, right lower limb: Secondary | ICD-10-CM | POA: Diagnosis present

## 2015-03-22 HISTORY — DX: Cerebral infarction, unspecified: I63.9

## 2015-03-22 LAB — CBC
HCT: 37.3 % — ABNORMAL LOW (ref 40.0–52.0)
Hemoglobin: 12.4 g/dL — ABNORMAL LOW (ref 13.0–18.0)
MCH: 29 pg (ref 26.0–34.0)
MCHC: 33.3 g/dL (ref 32.0–36.0)
MCV: 87.1 fL (ref 80.0–100.0)
PLATELETS: 182 10*3/uL (ref 150–440)
RBC: 4.28 MIL/uL — ABNORMAL LOW (ref 4.40–5.90)
RDW: 12.9 % (ref 11.5–14.5)
WBC: 10.2 10*3/uL (ref 3.8–10.6)

## 2015-03-22 LAB — COMPREHENSIVE METABOLIC PANEL
ALT: 16 U/L — ABNORMAL LOW (ref 17–63)
ANION GAP: 12 (ref 5–15)
AST: 14 U/L — AB (ref 15–41)
Albumin: 4 g/dL (ref 3.5–5.0)
Alkaline Phosphatase: 54 U/L (ref 38–126)
BUN: 23 mg/dL — AB (ref 6–20)
CHLORIDE: 98 mmol/L — AB (ref 101–111)
CO2: 26 mmol/L (ref 22–32)
Calcium: 9.3 mg/dL (ref 8.9–10.3)
Creatinine, Ser: 1.01 mg/dL (ref 0.61–1.24)
GFR calc Af Amer: >60 mL/min (ref >60)
Glucose, Bld: 269 mg/dL — ABNORMAL HIGH (ref 65–99)
POTASSIUM: 4 mmol/L (ref 3.5–5.1)
Sodium: 136 mmol/L (ref 135–145)
Total Bilirubin: 1 mg/dL (ref 0.3–1.2)
Total Protein: 7.5 g/dL (ref 6.5–8.1)

## 2015-03-22 MED ORDER — CLINDAMYCIN HCL 300 MG PO CAPS: 300.0000 mg | ORAL_CAPSULE | Freq: Three times a day (TID) | ORAL | Status: AC

## 2015-03-22 MED ORDER — VANCOMYCIN HCL IN DEXTROSE 1-5 GM/200ML-% IV SOLN
1000.0000 mg | Freq: Once | INTRAVENOUS | Status: AC
  Administered 2015-03-22: 1000 mg via INTRAVENOUS
  Filled 2015-03-22: qty 200

## 2015-03-22 NOTE — ED Notes (Signed)
Patient states he got up this morning and immediately felt nauseated, and light headed.Marland Kitchen He also states he had surgery on his right leg last week and it is "giving him a fit" swollen and red

## 2015-03-22 NOTE — ED Notes (Addendum)
Pt to triage from urgent care for right foot swelling, questionable pulse, cellulitis s/p growth removal 1 week ago. Foot  Warm swollen red. + pulse in triage

## 2015-03-22 NOTE — ED Provider Notes (Signed)
[facility] Emergency Department Provider Note  Time seen: 9:54 AM  I have reviewed the triage vital signs and the nursing notes.   HISTORY  Chief Complaint Foot Swelling    HPI Peter Nunez is a 74 y.o. male with a past medical history of hypertension, diabetes, hyperlipidemia, CVA, on Eliquis who presents to the emergency department with right lower extremity swelling, redness and discomfort. Patient also states he felt fairly nauseated this morning.Patient had a small skin abnormality removed approximately 1.5 weeks ago with a revision one week ago for dysplasia. Since that time he has had his leg wrapped in a compressive dressing, and over the past one week has noticed progressive redness, discomfort and swelling of his distal leg and foot. Patient went to his doctor this morning who sent him to the emergency department for further evaluation. Patient denies any chest pain or trouble breathing. States his nausea is on. Denies chest pain now or at any time. Denies fever. Currently describes his discomfort as a cramping type sensation in his lower foot, mild to moderate.     Past Medical History  Diagnosis Date  . HTN (hypertension)   . Diabetes mellitus ([place])   . Hyperlipidemia   . Stroke [facility])     Patient Active Problem List   Diagnosis Date Noted  . Atrial fibrillation with RVR ([place]) 10/27/2011  . HTN (hypertension) 10/23/2011  . Diabetes mellitus ([place]) 10/23/2011  . Acute respiratory failure ([place]) 10/23/2011  . Empyema ([place]) 10/23/2011  . Acute renal failure ([place]) 10/23/2011    Past Surgical History  Procedure Laterality Date  . Flexible bronchoscopy  10/23/2011    Procedure: FLEXIBLE BRONCHOSCOPY;  Surgeon: Gaye Pollack, MD;  Location: [place];  Service: Thoracic;  Laterality: N/A;  . Thoracotomy  10/23/2011    Procedure: THORACOTOMY MAJOR;  Surgeon: Gaye Pollack, MD;  Location: [facility] OR;  Service: Thoracic;  Laterality: Right;  . Pleural effusion  drainage  10/23/2011    Procedure: DRAINAGE OF PLEURAL EFFUSION;  Surgeon: Gaye Pollack, MD;  Location: [facility];  Service: Thoracic;  Laterality: Right;  . Decortication  10/23/2011    Procedure: DECORTICATION;  Surgeon: Gaye Pollack, MD;  Location: [place];  Service: Thoracic;  Laterality: Right;    Current Outpatient Rx  Name  Route  Sig  Dispense  Refill  . apixaban (ELIQUIS) 5 MG TABS tablet   Oral   Take 5 mg by mouth 2 (two) times daily.         Marland Kitchen EXPIRED: diltiazem (CARDIZEM CD) 120 MG 24 hr capsule   Oral   Take 1 capsule (120 mg total) by mouth daily.   30 capsule   6   . hydrochlorothiazide (HYDRODIURIL) 25 MG tablet   Oral   Take 25 mg by mouth daily.         . lansoprazole (PREVACID) 30 MG capsule   Oral   Take 30 mg by mouth daily.         . metFORMIN (GLUCOPHAGE) 500 MG tablet   Oral   Take 500 mg by mouth 2 (two) times daily with a meal.         . metoprolol tartrate (LOPRESSOR) 25 MG tablet   Oral   Take 0.5 tablets (12.5 mg total) by mouth 2 (two) times daily.   30 tablet   6   . niacin (NIASPAN) 500 MG CR tablet   Oral   Take 500 mg by mouth at  bedtime.           Allergies Bee venom and Percocet  History reviewed. No pertinent family history.  Social History Social History  Substance Use Topics  . Smoking status: Former Smoker -- 1.00 packs/day for 20 years    Types: Cigarettes    Quit date: 10/22/1980  . Smokeless tobacco: None  . Alcohol Use: No    Review of Systems Constitutional: Negative for fever Cardiovascular: Negative for chest pain. Respiratory: Negative for shortness of breath. Gastrointestinal: Negative for abdominal pain Musculoskeletal: Right distal leg and foot swelling and redness, with discomfort Neurological: Negative for headache 10-point ROS otherwise negative.  ____________________________________________   PHYSICAL EXAM:  VITAL SIGNS: ED Triage Vitals  Enc Vitals Group     BP 03/22/15 0919 177/84  mmHg     Pulse Rate 03/22/15 0919 94     Resp --      Temp 03/22/15 0919 98.2 F (36.8 C)     Temp Source 03/22/15 0919 Oral     SpO2 03/22/15 0919 93 %     Weight 03/22/15 0919 230 lb (104.327 kg)     Height 03/22/15 0919 6' (1.829 m)     Head Cir --      Peak Flow --      Pain Score 03/22/15 0923 0     Pain Loc --      Pain Edu? --      Excl. in [place]? --    Constitutional: Alert and oriented. Well appearing and in no distress. Eyes: Normal exam ENT   Head: Normocephalic and atraumatic.   Mouth/Throat: Mucous membranes are moist. Cardiovascular: Normal rate, regular rhythm. Respiratory: Normal respiratory effort without tachypnea nor retractions. Breath sounds are clear and equal bilaterally. No wheezes/rales/rhonchi. Gastrointestinal: Soft and nontender. No distention Musculoskeletal: Patient has a small skin incision approximately 1-1.5 cm in the distal right lower leg. Mild erythema surrounding, no fluctuance or signs of abscess. Patient does have erythema extending down into the foot, with moderate pedal edema. 2+ DP pulse palpated. Sensation intact and foot. Neurologic:  Normal speech and language. No gross focal neurologic deficits Skin:  Skin is warm, dry and intact.  Psychiatric: Mood and affect are normal. Speech and behavior are normal.  ____________________________________________   RADIOLOGY  Ultrasound negative for DVT  ____________________________________________    INITIAL IMPRESSION / ASSESSMENT AND PLAN / ED COURSE  Pertinent labs & imaging results that were available during my care of the patient were reviewed by me and considered in my medical decision making (see chart for details).  She presents the emergency department with right leg/foot redness, swelling and discomfort. Small skin wound with surrounding erythema most consistent with cellulitis. We will check labs, ultrasound to rule out DVT, and obtain blood cultures.  Ultrasound negative  for DVT. Labs are within normal limits besides a slightly elevated glucose. We will dose IV vancomycin and then discharge with clindamycin. Patient is follow up with his primary care physician in 2-3 days for recheck. I discussed return precautions for any increased redness/swelling, fever, nausea or vomiting. Patient agreeable  ____________________________________________   FINAL CLINICAL IMPRESSION(S) / ED DIAGNOSES  Right lower extremity cellulitis   Harvest Dark, MD 03/22/15 1229

## 2015-03-22 NOTE — ED Notes (Addendum)
Pt brought in via triage; sent over from urgent care center due to possible cellulitis of RLE. Pt with previous procedure to remove abnormal cells from RLE on 03/15/15. Pt A/OX4; vital signs WDL.  Right foot red, swollen, non-tender to touch, palpable pulse.

## 2015-03-22 NOTE — Discharge Instructions (Signed)
Please take your antibiotics as prescribed for their entire course. Please take Tylenol or Motrin as needed for any discomfort. Please follow-up with your primary care physician in 2-3 days for recheck. Return to the emergency department for any increasing redness/swelling, fever, nausea or vomiting.   Cellulitis Cellulitis is an infection of the skin and the tissue beneath it. The infected area is usually red and tender. Cellulitis occurs most often in the arms and lower legs.  CAUSES  Cellulitis is caused by bacteria that enter the skin through cracks or cuts in the skin. The most common types of bacteria that cause cellulitis are staphylococci and streptococci. SIGNS AND SYMPTOMS   Redness and warmth.  Swelling.  Tenderness or pain.  Fever. DIAGNOSIS  Your health care provider can usually determine what is wrong based on a physical exam. Blood tests may also be done. TREATMENT  Treatment usually involves taking an antibiotic medicine. HOME CARE INSTRUCTIONS   Take your antibiotic medicine as directed by your health care provider. Finish the antibiotic even if you start to feel better.  Keep the infected arm or leg elevated to reduce swelling.  Apply a warm cloth to the affected area up to 4 times per day to relieve pain.  Take medicines only as directed by your health care provider.  Keep all follow-up visits as directed by your health care provider. SEEK MEDICAL CARE IF:   You notice red streaks coming from the infected area.  Your red area gets larger or turns dark in color.  Your bone or joint underneath the infected area becomes painful after the skin has healed.  Your infection returns in the same area or another area.  You notice a swollen bump in the infected area.  You develop new symptoms.  You have a fever. SEEK IMMEDIATE MEDICAL CARE IF:   You feel very sleepy.  You develop vomiting or diarrhea.  You have a general ill feeling (malaise) with muscle  aches and pains.   This information is not intended to replace advice given to you by your health care provider. Make sure you discuss any questions you have with your health care provider.   Document Released: 02/13/2005 Document Revised: 01/25/2015 Document Reviewed: 07/22/2011 Elsevier Interactive Patient Education [employer].

## 2015-03-22 NOTE — ED Notes (Signed)
Patient sent by private vehicle to [facility] by private vehicle for evaluation and treatment of wound infection.  Report called to charge nurse Kathlee Nations) at 845am.

## 2015-03-22 NOTE — ED Provider Notes (Signed)
Patient presents today with symptoms of right leg swelling and redness. Patient states that he has had the symptoms progressively getting worse since last week. He states he had a lesion removed from his left side of his back and also his right leg by the dermatologist. Last night he felt nauseated and had chills. He is afebrile in the office today. He also admits to some calf tenderness but no chest pain or shortness of breath. He also has had increased pain in the leg with walking. Patient does have history of hypertension, diabetes, CVA, melanoma.  ROS: Negative except mentioned above. Vitals: As per Epic.   GENERAL: NAD HEENT: no pharyngeal erythema, no exudate RESP: CTA B CARD: RRR RLE: open wound dime sized on right anterior distal lower extremity, no significant discharge, moderate erythema around site extending to the medial aspect of the foot/ankle, diminished pedal pulses, neurologically intact, mild right calf tenderness  A/P: RLE Swelling/Erythema/Warmth- symptoms suggestive of cellulitis, would recommend further evaluation and treatment in the emergency department, patient may require IM/IV antibiotics and possible lower extremity ultrasound. Daughter will take patient to the emergency department now. Dressing was placed over the site prior to discharge.    Paulina Fusi, MD 03/22/15 [phone]

## 2015-03-27 LAB — CULTURE, BLOOD (ROUTINE X 2)
CULTURE: NO GROWTH
Culture: NO GROWTH

## 2016-09-27 ENCOUNTER — Other Ambulatory Visit: Payer: Self-pay | Admitting: Family

## 2016-09-27 DIAGNOSIS — J181 Lobar pneumonia, unspecified organism: Secondary | ICD-10-CM

## 2016-10-02 ENCOUNTER — Ambulatory Visit
Admission: RE | Admit: 2016-10-02 | Discharge: 2016-10-02 | Disposition: A | Discharge status: Home / Self Care | Payer: Medicare HMO | Source: Ambulatory Visit | Attending: Family | Admitting: Family

## 2016-10-02 DIAGNOSIS — J181 Lobar pneumonia, unspecified organism: Secondary | ICD-10-CM | POA: Diagnosis present

## 2018-02-19 ENCOUNTER — Other Ambulatory Visit: Payer: Self-pay

## 2018-02-19 ENCOUNTER — Ambulatory Visit: Payer: Medicare HMO | Attending: Nurse Practitioner | Admitting: Physical Therapy

## 2018-02-19 ENCOUNTER — Encounter: Payer: Self-pay | Admitting: Physical Therapy

## 2018-02-19 VITALS — BP 162/69 | HR 85

## 2018-02-19 DIAGNOSIS — R2681 Unsteadiness on feet: Secondary | ICD-10-CM | POA: Diagnosis present

## 2018-02-19 DIAGNOSIS — M6281 Muscle weakness (generalized): Secondary | ICD-10-CM | POA: Insufficient documentation

## 2018-02-19 DIAGNOSIS — R2689 Other abnormalities of gait and mobility: Secondary | ICD-10-CM | POA: Insufficient documentation

## 2018-02-19 NOTE — Therapy (Addendum)
[facility] [address]. [place], [place], [address] Phone: [phone]   Fax:  [phone]  Physical Therapy Evaluation  Patient Details  Name: Peter Nunez MRN: [id number] Date of Birth: 07/01/1940 Referring Provider (PT): Dellia Beckwith, [place]   Encounter Date: 02/19/2018  PT End of Session - 02/19/18 1427    Visit Number  1    Number of Visits  8    Date for PT Re-Evaluation  03/19/18    PT Start Time  0945    PT Stop Time  1042    PT Time Calculation (min)  57 min    Equipment Utilized During Treatment  Gait belt    Activity Tolerance  Patient tolerated treatment well    Behavior During Therapy  [facility] for tasks assessed/performed       Past Medical History:  Diagnosis Date  . Diabetes mellitus ([place])   . HTN (hypertension)   . Hyperlipidemia   . Stroke [facility])     Past Surgical History:  Procedure Laterality Date  . DECORTICATION  10/23/2011   Procedure: DECORTICATION;  Surgeon: Gaye Pollack, MD;  Location: [place];  Service: Thoracic;  Laterality: Right;  . FLEXIBLE BRONCHOSCOPY  10/23/2011   Procedure: FLEXIBLE BRONCHOSCOPY;  Surgeon: Gaye Pollack, MD;  Location: [facility] OR;  Service: Thoracic;  Laterality: N/A;  . PLEURAL EFFUSION DRAINAGE  10/23/2011   Procedure: DRAINAGE OF PLEURAL EFFUSION;  Surgeon: Gaye Pollack, MD;  Location: [place];  Service: Thoracic;  Laterality: Right;  . THORACOTOMY  10/23/2011   Procedure: THORACOTOMY MAJOR;  Surgeon: Gaye Pollack, MD;  Location: [facility] OR;  Service: Thoracic;  Laterality: Right;    Vitals:   02/19/18 1000  BP: (!) 162/69  Pulse: 85  SpO2: 97%     Subjective Assessment - 02/19/18 1417    Subjective  Pt. is pleasant 77 y.o. male seeking therapy s/p fall that occurred two weeks prior to initial evaluation.  Pt. is scheduled for an MRI tomorrow for impact of stroke.  Pt. currently describes weakness in L LE and reports he has been falling more frequently lately since stroke.   Pt. reports that he does have a cane at home that he typically uses when walking around outside his home.  Pt. denies any complaints other than weakness of L LE and balance deficits due to decreased strength.      Pertinent History  CVA, Type 2 Diabetes    Limitations  Walking;Standing    Patient Stated Goals  Pt. wants to prevent future falls from occurring.    Currently in Pain?  No/denies             SUBJECTIVE  Chief complaint:  Falling more frequently Onset:   Imaging: Yes, MRI scheduled for tomorrow. Recent changes in overall health/medication: No Directional pattern for falls: None Prior history of physical therapy for balance: None Follow-up appointment with MD: None scheduled Red flags (bowel/bladder changes, saddle paresthesia, personal history of cancer, chills/fever, night sweats, unrelenting pain) Negative   OBJECTIVE  MUSCULOSKELETAL: Tremor: Absent Bulk: Normal Tone: Normal, no clonus  Posture No gross abnormalities noted in standing or seated posture  Gait Shuffling of feet Scuffing of L LE Decreased ability to perform head turns when ambulating   Strength R/L 4+/4 Hip flexion 5/5 Hip extension  5/4 Hip adduction 5/4 Hip abduction 5/5 Knee extension 5/4+ Knee flexion 5/5 Ankle Plantarflexion 5/4+ Ankle Dorsiflexion      Coordination/Cerebellar Finger to  Nose: WNL Heel to Shin: WNL Rapid alternating movements: WNL Finger Opposition: WNL    FUNCTIONAL OUTCOME MEASURES   Results Comments  BERG 51/56 Fall risk, in need of intervention  DGI 17/24 Fall risk, in need of intervention  TUG 21.16 seconds Fall risk, in need of intervention  5TSTS 19.43 seconds Fall risk, in need of intervention       ASSESSMENT Pt. is a pleasant 77 year-old male referred for balance deficits and weakness of L LE.  PT examination reveals deficits in strength of B hip flexion, R hip adduction, R hip abduction, R knee flexion, and R ankle dorsiflexion.   Pt. Also demonstrates decreased balance with no AD when ambulating.  Pt. Scored 17/24 on DGI, and 51/56 on the Berg. Pt. presents with deficits in strength of LE's and balance. Pt will benefit from skilled PT services to address deficits and return to prior level of function at home.    PT Education - 02/19/18 1426    Education Details  Pt. educated on Physical Therapy and benefits/ effectiveness on balance.    Person(s) Educated  Patient    Methods  Explanation    Comprehension  Verbalized understanding       PT Short Term Goals - 02/19/18 1501      PT SHORT TERM GOAL #1   Title  Pt will be independent with HEP in order to improve strength and balance in order to decrease fall risk and improve function at home and work.     Baseline  Pt. to be given HEP at next visit.    Time  2    Period  Weeks    Status  New    Target Date  03/05/18        PT Long Term Goals - 02/19/18 1502      PT LONG TERM GOAL #1   Title  Pt will improve BERG by at least 3 points in order to demonstrate clinically significant improvement in balance.     Baseline  02/19/18: BERG: 51/56    Time  4    Period  Weeks    Status  New    Target Date  03/19/18      PT LONG TERM GOAL #2   Title  Pt will improve DGI by at least 3 points in order to demonstrate clinically significant improvement in balance and decreased risk for falls.    Baseline  02/19/18: DGI: 17/24    Time  4    Period  Weeks    Status  New    Target Date  03/19/18      PT LONG TERM GOAL #3   Title  Pt will decrease 5TSTS by at least 3 seconds in order to demonstrate clinically significant improvement in LE strength.    Baseline  02/19/18: 5TSTS: 19.43 sec    Time  4    Period  Weeks    Status  New    Target Date  03/19/18      PT LONG TERM GOAL #4   Title  Pt will decrease TUG to below 14 seconds/decrease 23% in order to demonstrate decreased fall risk.    Baseline  02/19/18 TUG: 21.16 sec    Time  4    Period  Weeks    Status  New     Target Date  03/19/18        Plan - 02/19/18 1428    Clinical Impression Statement  Pt. is a pleasant  77 year-old male referred for balance deficits and weakness of L LE.  PT examination reveals deficits in strength of B hip flexion, R hip adduction, R hip abduction, R knee flexion, and R ankle dorsiflexion.  Pt. Also demonstrates decreased balance with no AD when ambulating.  Pt. Scored 17/24 on DGI, and 51/56 on the Berg. Pt. presents with deficits in strength of LE's and balance. Pt will benefit from skilled PT services to address deficits and return to prior level of function at home.    Clinical Presentation  Evolving    Clinical Presentation due to:  (+) previous encounter with PT, (-) Medical Hx.    Clinical Decision Making  Moderate    Rehab Potential  Fair    PT Frequency  2x / week    PT Duration  4 weeks    PT Treatment/Interventions  Cryotherapy;Canalith Repostioning;Moist Heat;Electrical Stimulation;Gait training;Stair training;Functional mobility training;Therapeutic activities;Therapeutic exercise;Manual techniques;Patient/family education;Neuromuscular re-education;Balance training;Passive range of motion;Dry needling;Energy conservation;Taping;Vestibular;Joint Manipulations;Spinal Manipulations;Visual/perceptual remediation/compensation;ADLs/Self Care Home Management    PT Next Visit Plan  Give HEP    PT Home Exercise Plan  Give at next visit.    Consulted and Agree with Plan of Care  Patient       Patient will benefit from skilled therapeutic intervention in order to improve the following deficits and impairments:  Abnormal gait, Decreased endurance, Decreased activity tolerance, Decreased strength, Decreased balance, Decreased mobility, Difficulty walking, Decreased coordination  Visit Diagnosis: Muscle weakness (generalized)  Other abnormalities of gait and mobility  Unsteady gait     Problem List Patient Active Problem List   Diagnosis Date Noted  . Atrial  fibrillation with RVR ([place]) 10/27/2011  . HTN (hypertension) 10/23/2011  . Diabetes mellitus ([place]) 10/23/2011  . Acute respiratory failure ([place]) 10/23/2011  . Empyema ([place]) 10/23/2011  . Acute renal failure ([place]) 10/23/2011   Pura Spice, PT, DPT # [id number] Gwenlyn Saran, SPT 02/20/2018, 1:42 PM  [facility] [address] [place], [place], [address] Phone: [phone]   Fax:  [phone]  Name: Peter Nunez MRN: [id number] Date of Birth: 1941/03/29

## 2018-02-23 ENCOUNTER — Ambulatory Visit: Payer: Medicare HMO | Admitting: Physical Therapy

## 2018-02-23 ENCOUNTER — Encounter: Payer: Self-pay | Admitting: Physical Therapy

## 2018-02-23 DIAGNOSIS — R2681 Unsteadiness on feet: Secondary | ICD-10-CM

## 2018-02-23 DIAGNOSIS — M6281 Muscle weakness (generalized): Secondary | ICD-10-CM | POA: Diagnosis not present

## 2018-02-23 DIAGNOSIS — R2689 Other abnormalities of gait and mobility: Secondary | ICD-10-CM

## 2018-02-23 NOTE — Therapy (Signed)
[place] [facility] [address]. [place], [place], [address] Phone: [phone]   Fax:  [phone]  Physical Therapy Treatment  Patient Details  Name: Peter Nunez MRN: [id number] Date of Birth: 05-24-1940 Referring Provider (PT): Dellia Beckwith, [place]   Encounter Date: 02/23/2018  PT End of Session - 02/23/18 1101    Visit Number  2    Number of Visits  8    Date for PT Re-Evaluation  03/19/18    PT Start Time  1046    PT Stop Time  1143    PT Time Calculation (min)  57 min    Equipment Utilized During Treatment  Gait belt    Activity Tolerance  Patient tolerated treatment well    Behavior During Therapy  [facility] for tasks assessed/performed       Past Medical History:  Diagnosis Date  . Diabetes mellitus ([place])   . HTN (hypertension)   . Hyperlipidemia   . Stroke [facility])     Past Surgical History:  Procedure Laterality Date  . DECORTICATION  10/23/2011   Procedure: DECORTICATION;  Surgeon: Gaye Pollack, MD;  Location: [place];  Service: Thoracic;  Laterality: Right;  . FLEXIBLE BRONCHOSCOPY  10/23/2011   Procedure: FLEXIBLE BRONCHOSCOPY;  Surgeon: Gaye Pollack, MD;  Location[facility] OR;  Service: Thoracic;  Laterality: N/A;  . PLEURAL EFFUSION DRAINAGE  10/23/2011   Procedure: DRAINAGE OF PLEURAL EFFUSION;  Surgeon: Gaye Pollack, MD;  Location: [place];  Service: Thoracic;  Laterality: Right;  . THORACOTOMY  10/23/2011   Procedure: THORACOTOMY MAJOR;  Surgeon: Gaye Pollack, MD;  Location: [place];  Service: Thoracic;  Laterality: Right;    There were no vitals filed for this visit.  Subjective Assessment - 02/23/18 1056    Subjective  Pt. reports that he was working on his tractor motor over the weekend.  Pt. reports that he did not have any balance issues with working on the tractor.  Pt. says that he has been on his new brain pill since last week and he has noticed a significant difference.    Pertinent History  CVA, Type 2 Diabetes     Limitations  Walking;Standing    Patient Stated Goals  Pt. wants to prevent future falls from occurring.        Treatment:   Neuro Re-education  // bars: airex static stance, EO, EC, airex tandem stance, EO, no UE support, CGA Hallway Walking with head turns x4 Hallway Walking with letter recall x4 Hallway Walking with changes in gait speed x4 Ambulation outside across varying surfaces (grass, mulch, pavement, curbs, etc.) Stair training x15, CGA, no UE support, VCs for posture Hallway walking with use of NBQC with verbal cueing for placement of cane    PT Short Term Goals - 02/19/18 1501      PT SHORT TERM GOAL #1   Title  Pt will be independent with HEP in order to improve strength and balance in order to decrease fall risk and improve function at home and work.     Baseline  Pt. to be given HEP at next visit.    Time  2    Period  Weeks    Status  New    Target Date  03/05/18        PT Long Term Goals - 02/19/18 1502      PT LONG TERM GOAL #1   Title  Pt will improve BERG by at least  3 points in order to demonstrate clinically significant improvement in balance.     Baseline  02/19/18: BERG: 51/56    Time  4    Period  Weeks    Status  New    Target Date  03/19/18      PT LONG TERM GOAL #2   Title  Pt will improve DGI by at least 3 points in order to demonstrate clinically significant improvement in balance and decreased risk for falls.    Baseline  02/19/18: DGI: 17/24    Time  4    Period  Weeks    Status  New    Target Date  03/19/18      PT LONG TERM GOAL #3   Title  Pt will decrease 5TSTS by at least 3 seconds in order to demonstrate clinically significant improvement in LE strength.    Baseline  02/19/18: 5TSTS: 19.43 sec    Time  4    Period  Weeks    Status  New    Target Date  03/19/18      PT LONG TERM GOAL #4   Title  Pt will decrease TUG to below 14 seconds/decrease 23% in order to demonstrate decreased fall risk.    Baseline  02/19/18 TUG:  21.16 sec    Time  4    Period  Weeks    Status  New    Target Date  03/19/18        Plan - 02/23/18 1101    Clinical Impression Statement  Pt. performed well with balance exercises and consistently demonstrates decreased ability to perform balanace activities without use of UE for support.  Pt. also demonstrates decreased step length with L LE and requires constant verbal cueing for increasing step length along with lifting foot higher off the ground.  Pt. struggles with upright posture and is able to correct when given verbal cueing.      Clinical Presentation  Evolving    Clinical Decision Making  Moderate    Rehab Potential  Fair    PT Frequency  2x / week    PT Duration  4 weeks    PT Treatment/Interventions  Cryotherapy;Canalith Repostioning;Moist Heat;Electrical Stimulation;Gait training;Stair training;Functional mobility training;Therapeutic activities;Therapeutic exercise;Manual techniques;Patient/family education;Neuromuscular re-education;Balance training;Passive range of motion;Dry needling;Energy conservation;Taping;Vestibular;Joint Manipulations;Spinal Manipulations;Visual/perceptual remediation/compensation;ADLs/Self Care Home Management    PT Next Visit Plan  Give HEP    PT Home Exercise Plan  Give at next visit.    Consulted and Agree with Plan of Care  Patient       Patient will benefit from skilled therapeutic intervention in order to improve the following deficits and impairments:  Abnormal gait, Decreased endurance, Decreased activity tolerance, Decreased strength, Decreased balance, Decreased mobility, Difficulty walking, Decreased coordination  Visit Diagnosis: Muscle weakness (generalized)  Other abnormalities of gait and mobility  Unsteady gait     Problem List Patient Active Problem List   Diagnosis Date Noted  . Atrial fibrillation with RVR ([place]) 10/27/2011  . HTN (hypertension) 10/23/2011  . Diabetes mellitus ([place]) 10/23/2011  . Acute respiratory  failure ([place]) 10/23/2011  . Empyema ([place]) 10/23/2011  . Acute renal failure ([place]) 10/23/2011   Pura Spice, PT, DPT # [id number] Gwenlyn Saran, SPT 02/23/2018, 12:56 PM  [place] [facility] [address] [place], [place], [address] Phone: [phone]   Fax:  [phone]  Name: Peter Nunez MRN: [id number] Date of Birth: 05-05-1941

## 2018-02-26 ENCOUNTER — Ambulatory Visit: Payer: Medicare HMO | Admitting: Physical Therapy

## 2018-02-26 ENCOUNTER — Encounter: Payer: Self-pay | Admitting: Physical Therapy

## 2018-02-26 DIAGNOSIS — M6281 Muscle weakness (generalized): Secondary | ICD-10-CM | POA: Diagnosis not present

## 2018-02-26 DIAGNOSIS — R2681 Unsteadiness on feet: Secondary | ICD-10-CM

## 2018-02-26 DIAGNOSIS — R2689 Other abnormalities of gait and mobility: Secondary | ICD-10-CM

## 2018-02-26 NOTE — Therapy (Signed)
[place] [facility] [address] [place], [place], [address] Phone: [phone]   Fax:  [phone]  Physical Therapy Treatment  Patient Details  Name: Peter Nunez MRN: [id number] Date of Birth: 26-Sep-1940 Referring Provider (PT): Dellia Beckwith, [place]   Encounter Date: 02/26/2018  PT End of Session - 02/26/18 1125    Visit Number  3    Number of Visits  8    Date for PT Re-Evaluation  03/19/18    PT Start Time  1114    PT Stop Time  1201    PT Time Calculation (min)  47 min    Equipment Utilized During Treatment  Gait belt    Activity Tolerance  Patient tolerated treatment well    Behavior During Therapy  [facility] for tasks assessed/performed       Past Medical History:  Diagnosis Date  . Diabetes mellitus ([place])   . HTN (hypertension)   . Hyperlipidemia   . Stroke [facility])     Past Surgical History:  Procedure Laterality Date  . DECORTICATION  10/23/2011   Procedure: DECORTICATION;  Surgeon: Gaye Pollack, MD;  Location: [place];  Service: Thoracic;  Laterality: Right;  . FLEXIBLE BRONCHOSCOPY  10/23/2011   Procedure: FLEXIBLE BRONCHOSCOPY;  Surgeon: Gaye Pollack, MD;  Location: [facility] OR;  Service: Thoracic;  Laterality: N/A;  . PLEURAL EFFUSION DRAINAGE  10/23/2011   Procedure: DRAINAGE OF PLEURAL EFFUSION;  Surgeon: Gaye Pollack, MD;  Location: [place];  Service: Thoracic;  Laterality: Right;  . THORACOTOMY  10/23/2011   Procedure: THORACOTOMY MAJOR;  Surgeon: Gaye Pollack, MD;  Location: [place];  Service: Thoracic;  Laterality: Right;    There were no vitals filed for this visit.  Subjective Assessment - 02/26/18 1124    Subjective  Pt. reports that he can tell a big difference since coming to therapy.  Pt. notes that he has had bad habits and now that we are fixing them he is walking better. He reports that his R leg felt as though it was giving away this morning after getting up from bed.    Pertinent History  CVA, Type 2 Diabetes     Limitations  Walking;Standing    Patient Stated Goals  Pt. wants to prevent future falls from occurring.            Treatment:   Neuro Re-education  Ambulation outside across varying surfaces (grass, mulch, pavement, curbs, etc.) x2 Stair Training with VC for proper body alignment x10 Ambulation around gym with use of SPC with VC for heel strike on L LE and increase step height/length      There Ex:  Seated Hamstring Curls with GTB 2x15 Seated Clamshells with GTB 2x15 Sit-to-Stands 2x10         PT Short Term Goals - 02/19/18 1501      PT SHORT TERM GOAL #1   Title  Pt will be independent with HEP in order to improve strength and balance in order to decrease fall risk and improve function at home and work.     Baseline  Pt. to be given HEP at next visit.    Time  2    Period  Weeks    Status  New    Target Date  03/05/18        PT Long Term Goals - 02/19/18 1502      PT LONG TERM GOAL #1   Title  Pt will improve BERG  by at least 3 points in order to demonstrate clinically significant improvement in balance.     Baseline  02/19/18: BERG: 51/56    Time  4    Period  Weeks    Status  New    Target Date  03/19/18      PT LONG TERM GOAL #2   Title  Pt will improve DGI by at least 3 points in order to demonstrate clinically significant improvement in balance and decreased risk for falls.    Baseline  02/19/18: DGI: 17/24    Time  4    Period  Weeks    Status  New    Target Date  03/19/18      PT LONG TERM GOAL #3   Title  Pt will decrease 5TSTS by at least 3 seconds in order to demonstrate clinically significant improvement in LE strength.    Baseline  02/19/18: 5TSTS: 19.43 sec    Time  4    Period  Weeks    Status  New    Target Date  03/19/18      PT LONG TERM GOAL #4   Title  Pt will decrease TUG to below 14 seconds/decrease 23% in order to demonstrate decreased fall risk.    Baseline  02/19/18 TUG: 21.16 sec    Time  4    Period  Weeks     Status  New    Target Date  03/19/18            Plan - 02/26/18 1309    Clinical Impression Statement  Pt. performed well with ambulation today, however had noted fatigue that required more frequent rest breaks.  Pt. was able to perform ambulation well, however required verbal cuing for increased step length, and heel strike on the L LE.  Pt. demonstrates notable decreased strength in L LE with standing, stair ascension/descension, and     Rehab Potential  Fair    PT Frequency  2x / week    PT Duration  4 weeks    PT Treatment/Interventions  Cryotherapy;Canalith Repostioning;Moist Heat;Electrical Stimulation;Gait training;Stair training;Functional mobility training;Therapeutic activities;Therapeutic exercise;Manual techniques;Patient/family education;Neuromuscular re-education;Balance training;Passive range of motion;Dry needling;Energy conservation;Taping;Vestibular;Joint Manipulations;Spinal Manipulations;Visual/perceptual remediation/compensation;ADLs/Self Care Home Management    PT Next Visit Plan  Give HEP    PT Home Exercise Plan  Give at next visit.    Consulted and Agree with Plan of Care  Patient       Patient will benefit from skilled therapeutic intervention in order to improve the following deficits and impairments:  Abnormal gait, Decreased endurance, Decreased activity tolerance, Decreased strength, Decreased balance, Decreased mobility, Difficulty walking, Decreased coordination  Visit Diagnosis: Muscle weakness (generalized)  Other abnormalities of gait and mobility  Unsteady gait     Problem List Patient Active Problem List   Diagnosis Date Noted  . Atrial fibrillation with RVR ([place]) 10/27/2011  . HTN (hypertension) 10/23/2011  . Diabetes mellitus ([place]) 10/23/2011  . Acute respiratory failure ([place]) 10/23/2011  . Empyema ([place]) 10/23/2011  . Acute renal failure ([place]) 10/23/2011   Pura Spice, PT, DPT # [id number] Gwenlyn Saran, SPT 02/27/2018, 12:52  PM  [place] [facility] [address] [place], [place], [address] Phone: [phone]   Fax:  [phone]  Name: Peter Nunez MRN: [id number] Date of Birth: 07-23-1940

## 2018-03-10 ENCOUNTER — Ambulatory Visit: Payer: Medicare HMO | Admitting: Physical Therapy

## 2018-03-10 DIAGNOSIS — M6281 Muscle weakness (generalized): Secondary | ICD-10-CM

## 2018-03-10 DIAGNOSIS — R2681 Unsteadiness on feet: Secondary | ICD-10-CM

## 2018-03-10 DIAGNOSIS — R2689 Other abnormalities of gait and mobility: Secondary | ICD-10-CM

## 2018-03-14 NOTE — Therapy (Addendum)
[facility] [address] [place], [place], [address] Phone: [phone]   Fax:  [phone]  Physical Therapy Treatment  Patient Details  Name: Peter Nunez MRN: [id number] Date of Birth: 1940-10-31 Referring Provider (PT): Dellia Beckwith, [place]   Encounter Date: 03/10/2018    Encounter Date: 03/10/2018  PT End of Session - 03/14/18 0813    Visit Number  4    Number of Visits  8    Date for PT Re-Evaluation  03/19/18    PT Start Time  1111    PT Stop Time  1203    PT Time Calculation (min)  52 min    Equipment Utilized During Treatment  Gait belt    Activity Tolerance  Patient tolerated treatment well    Behavior During Therapy  [facility] for tasks assessed/performed        Past Medical History:  Diagnosis Date  . Diabetes mellitus ([place])   . HTN (hypertension)   . Hyperlipidemia   . Stroke [facility])     Past Surgical History:  Procedure Laterality Date  . DECORTICATION  10/23/2011   Procedure: DECORTICATION;  Surgeon: Gaye Pollack, MD;  Location: [place];  Service: Thoracic;  Laterality: Right;  . FLEXIBLE BRONCHOSCOPY  10/23/2011   Procedure: FLEXIBLE BRONCHOSCOPY;  Surgeon: Gaye Pollack, MD;  Location: [facility];  Service: Thoracic;  Laterality: N/A;  . PLEURAL EFFUSION DRAINAGE  10/23/2011   Procedure: DRAINAGE OF PLEURAL EFFUSION;  Surgeon: Gaye Pollack, MD;  Location: [place];  Service: Thoracic;  Laterality: Right;  . THORACOTOMY  10/23/2011   Procedure: THORACOTOMY MAJOR;  Surgeon: Gaye Pollack, MD;  Location: [place];  Service: Thoracic;  Laterality: Right;    There were no vitals filed for this visit.  Subjective Assessment - 03/17/18 0715    Subjective  Pt. entered PT with new c/o increase R low back pain since last Thursday (10/17).  Pt. reports he was trying to change the lightbulb in his microwave and strained his low back.  Pt. reports the task took up to an hour and involved twisting/ ackward positions.       Pertinent History  CVA, Type 2 Diabetes    Limitations  Walking;Standing    Patient Stated Goals  Pt. wants to prevent future falls from occurring.    Currently in Pain?  Yes    Pain Score  4     Pain Location  Back    Pain Orientation  Right    Pain Descriptors / Indicators  Aching    Pain Type  Acute pain           Treatment:   Neuro Re-education  Ambulation outside across varying surfaces (grass, mulch, pavement, curbs, etc.)- 11 min. Stair Training with VC for proper body alignment x10 (varying use of UE assist). Ambulation around gym with use of SPC on L with VC for heel strike/ step pattern/ length.  Moderate cuing for posture correction/ head position.   Airex step ups/ static and dynamic reaching Step ups/ overs with no UE assist on 3"/ 6" plinth.       There Ex:  Standing marching/ lateral walking with GTB 10x each.   Supine ex.: marching/ bridging with bolster/ SAQ with mod. Manual resistance/ SLR/ modified bicycles.  Sit-to-Stands 2x10    PT Short Term Goals - 02/19/18 1501      PT SHORT TERM GOAL #1   Title  Pt will be independent with  HEP in order to improve strength and balance in order to decrease fall risk and improve function at home and work.     Baseline  Pt. to be given HEP at next visit.    Time  2    Period  Weeks    Status  New    Target Date  03/05/18        PT Long Term Goals - 02/19/18 1502      PT LONG TERM GOAL #1   Title  Pt will improve BERG by at least 3 points in order to demonstrate clinically significant improvement in balance.     Baseline  02/19/18: BERG: 51/56    Time  4    Period  Weeks    Status  New    Target Date  03/19/18      PT LONG TERM GOAL #2   Title  Pt will improve DGI by at least 3 points in order to demonstrate clinically significant improvement in balance and decreased risk for falls.    Baseline  02/19/18: DGI: 17/24    Time  4    Period  Weeks    Status  New    Target Date  03/19/18      PT  LONG TERM GOAL #3   Title  Pt will decrease 5TSTS by at least 3 seconds in order to demonstrate clinically significant improvement in LE strength.    Baseline  02/19/18: 5TSTS: 19.43 sec    Time  4    Period  Weeks    Status  New    Target Date  03/19/18      PT LONG TERM GOAL #4   Title  Pt will decrease TUG to below 14 seconds/decrease 23% in order to demonstrate decreased fall risk.    Baseline  02/19/18 TUG: 21.16 sec    Time  4    Period  Weeks    Status  New    Target Date  03/19/18         Plan - 03/17/18 1144    Clinical Impression Statement  Moderate hip/LE muscle tightness noted during supine stretches.  Pt. ambulates with limited step pattern/ length and moderate R antalgic gait pattern without assistive device.  PT instructed in proper use of SPC on L with 2-point gait pattern.  Pt. reports decrease back pain after stretches/ ther.ex.  Pt. will benefit from use of SPC to offload R low back pain.      Clinical Presentation  Evolving    Clinical Decision Making  Moderate    Rehab Potential  Fair    PT Frequency  2x / week    PT Duration  4 weeks    PT Treatment/Interventions  Cryotherapy;Canalith Repostioning;Moist Heat;Electrical Stimulation;Gait training;Stair training;Functional mobility training;Therapeutic activities;Therapeutic exercise;Manual techniques;Patient/family education;Neuromuscular re-education;Balance training;Passive range of motion;Dry needling;Energy conservation;Taping;Vestibular;Joint Manipulations;Spinal Manipulations;Visual/perceptual remediation/compensation;ADLs/Self Care Home Management    PT Next Visit Plan  Reassess goals.        Patient will benefit from skilled therapeutic intervention in order to improve the following deficits and impairments:  Abnormal gait, Decreased endurance, Decreased activity tolerance, Decreased strength, Decreased balance, Decreased mobility, Difficulty walking, Decreased coordination  Visit Diagnosis: Muscle  weakness (generalized)  Other abnormalities of gait and mobility  Unsteady gait     Problem List Patient Active Problem List   Diagnosis Date Noted  . Atrial fibrillation with RVR ([place]) 10/27/2011  . HTN (hypertension) 10/23/2011  . Diabetes mellitus ([place]) 10/23/2011  . Acute  respiratory failure ([place]) 10/23/2011  . Empyema ([place]) 10/23/2011  . Acute renal failure ([place]) 10/23/2011   Pura Spice, PT, DPT # [phone] 03/17/2018, 11:53 AM  [place] [address] [place], [place], [address] Phone: [phone]   Fax:  [phone]  Name: ROWE WARMAN MRN: [id number] Date of Birth: Jul 26, 1940

## 2018-03-17 ENCOUNTER — Ambulatory Visit: Payer: Medicare HMO | Admitting: Physical Therapy

## 2018-03-17 ENCOUNTER — Encounter: Payer: Self-pay | Admitting: Physical Therapy

## 2018-03-17 DIAGNOSIS — M6281 Muscle weakness (generalized): Secondary | ICD-10-CM

## 2018-03-17 DIAGNOSIS — R2689 Other abnormalities of gait and mobility: Secondary | ICD-10-CM

## 2018-03-17 DIAGNOSIS — R2681 Unsteadiness on feet: Secondary | ICD-10-CM

## 2018-03-17 NOTE — Therapy (Signed)
[facility] [address] [place], [place], [address] Phone: [phone]   Fax:  [phone]  Physical Therapy Treatment  Patient Details  Name: Peter Nunez MRN: [id number] Date of Birth: Apr 04, 1941 Referring Provider (PT): Dellia Beckwith, [place]   Encounter Date: 03/17/2018    Treatment: 5 of 8.  Recert date: 47/42/59 5638 to 1201   Past Medical History:  Diagnosis Date  . Diabetes mellitus ([place])   . HTN (hypertension)   . Hyperlipidemia   . Stroke [facility])     Past Surgical History:  Procedure Laterality Date  . DECORTICATION  10/23/2011   Procedure: DECORTICATION;  Surgeon: Gaye Pollack, MD;  Location: [place];  Service: Thoracic;  Laterality: Right;  . FLEXIBLE BRONCHOSCOPY  10/23/2011   Procedure: FLEXIBLE BRONCHOSCOPY;  Surgeon: Gaye Pollack, MD;  Location: [facility] OR;  Service: Thoracic;  Laterality: N/A;  . PLEURAL EFFUSION DRAINAGE  10/23/2011   Procedure: DRAINAGE OF PLEURAL EFFUSION;  Surgeon: Gaye Pollack, MD;  Location: [place];  Service: Thoracic;  Laterality: Right;  . THORACOTOMY  10/23/2011   Procedure: THORACOTOMY MAJOR;  Surgeon: Gaye Pollack, MD;  Location: [place];  Service: Thoracic;  Laterality: Right;    There were no vitals filed for this visit.    Pt. report no increase in pain since last visit, and has noted that he has felt much better since last treatment session, and back pain has been minimal. Pt. notes increase in capability to perform functional tasks such as stair climbing since coming to PT.       Treatment:   Neuro Re-education  Stair Training with VC for proper body alignment, fading UE support,  x20 Static Stance on Airex 4x30 sec Tandem Stance on Airex 4x30 sec SLS Stance on Airex 4x30 sec Marching on Airex 2x30 sec Sit-to-Stands 2x10 Ambulation inside of gym with VC for postural corrections in order to improve balance x400 ft.  BERG Balance was Re-assessed and patient noted to  have improved to 54/56      Pt. noted to have eversion in R ankle with exercises, in which pt. compensates by externally rotating R hop to feel more stable whenambulating and performing tasks. Pt. is able to correct form, on stairs, however has much more difficulty with using LE to control descent down stairs without UE support. Pt. will continue to benefit from strengthening and balance training to prevent future falls.  PT Short Term Goals - 02/19/18 1501      PT SHORT TERM GOAL #1   Title  Pt will be independent with HEP in order to improve strength and balance in order to decrease fall risk and improve function at home and work.     Baseline  Pt. to be given HEP at next visit.    Time  2    Period  Weeks    Status  New    Target Date  03/05/18        PT Long Term Goals - 02/19/18 1502      PT LONG TERM GOAL #1   Title  Pt will improve BERG by at least 3 points in order to demonstrate clinically significant improvement in balance.     Baseline  02/19/18: BERG: 51/56    Time  4    Period  Weeks    Status  New    Target Date  03/19/18      PT LONG TERM GOAL #2   Title  Pt will improve DGI by at least 3 points in order to demonstrate clinically significant improvement in balance and decreased risk for falls.    Baseline  02/19/18: DGI: 17/24    Time  4    Period  Weeks    Status  New    Target Date  03/19/18      PT LONG TERM GOAL #3   Title  Pt will decrease 5TSTS by at least 3 seconds in order to demonstrate clinically significant improvement in LE strength.    Baseline  02/19/18: 5TSTS: 19.43 sec    Time  4    Period  Weeks    Status  New    Target Date  03/19/18      PT LONG TERM GOAL #4   Title  Pt will decrease TUG to below 14 seconds/decrease 23% in order to demonstrate decreased fall risk.    Baseline  02/19/18 TUG: 21.16 sec    Time  4    Period  Weeks    Status  New    Target Date  03/19/18        Patient will benefit from skilled therapeutic  intervention in order to improve the following deficits and impairments:  Abnormal gait, Decreased endurance, Decreased activity tolerance, Decreased strength, Decreased balance, Decreased mobility, Difficulty walking, Decreased coordination  Visit Diagnosis: Muscle weakness (generalized)  Other abnormalities of gait and mobility  Unsteady gait     Problem List Patient Active Problem List   Diagnosis Date Noted  . Atrial fibrillation with RVR ([place]) 10/27/2011  . HTN (hypertension) 10/23/2011  . Diabetes mellitus ([place]) 10/23/2011  . Acute respiratory failure ([place]) 10/23/2011  . Empyema ([place]) 10/23/2011  . Acute renal failure ([place]) 10/23/2011   Pura Spice, PT, DPT # [place], SPT 03/19/2018, 7:28 AM  [place] [facility] [address] [place], [place], [address] Phone: [phone]   Fax:  [phone]  Name: Peter Nunez MRN: [id number] Date of Birth: 1941-04-23

## 2018-03-19 ENCOUNTER — Encounter: Payer: Self-pay | Admitting: Physical Therapy

## 2018-03-19 ENCOUNTER — Ambulatory Visit: Payer: Medicare HMO | Admitting: Physical Therapy

## 2018-03-19 DIAGNOSIS — M6281 Muscle weakness (generalized): Secondary | ICD-10-CM | POA: Diagnosis not present

## 2018-03-19 DIAGNOSIS — R2681 Unsteadiness on feet: Secondary | ICD-10-CM

## 2018-03-19 DIAGNOSIS — R2689 Other abnormalities of gait and mobility: Secondary | ICD-10-CM

## 2018-03-19 NOTE — Therapy (Addendum)
[facility] [address] [place], [place], [address] Phone: [phone]   Fax:  [phone]  Physical Therapy Treatment  Patient Details  Name: Peter Nunez MRN: [id number] Date of Birth: [birth year] Referring Provider (PT): Dellia Beckwith, [place]   Encounter Date: 03/19/2018   Treatment: 6 of 10.  Recert date: 38/18/29. 1107 to 1200    Past Medical History:  Diagnosis Date  . Diabetes mellitus ([place])   . HTN (hypertension)   . Hyperlipidemia   . Stroke [facility])     Past Surgical History:  Procedure Laterality Date  . DECORTICATION  10/23/2011   Procedure: DECORTICATION;  Surgeon: Gaye Pollack, MD;  Location: [place];  Service: Thoracic;  Laterality: Right;  . FLEXIBLE BRONCHOSCOPY  10/23/2011   Procedure: FLEXIBLE BRONCHOSCOPY;  Surgeon: Gaye Pollack, MD;  Location: [facility] OR;  Service: Thoracic;  Laterality: N/A;  . PLEURAL EFFUSION DRAINAGE  10/23/2011   Procedure: DRAINAGE OF PLEURAL EFFUSION;  Surgeon: Gaye Pollack, MD;  Location: [place];  Service: Thoracic;  Laterality: Right;  . THORACOTOMY  10/23/2011   Procedure: THORACOTOMY MAJOR;  Surgeon: Gaye Pollack, MD;  Location: [place];  Service: Thoracic;  Laterality: Right;    There were no vitals filed for this visit.  Subjective Assessment - 03/23/18 0815    Subjective  Pt. reports no new complaints upon arrival and has been performing HEP at home.  Pt. notes he has increased his balance significantly and is happy with his progress in PT.    Pertinent History  CVA, Type 2 Diabetes    Limitations  Walking;Standing    Patient Stated Goals  Pt. wants to prevent future falls from occurring.    Currently in Pain?  No/denies    Pain Score  0-No pain           Treatment:   There Ex.  IT trainer with VC for proper body alignment, fading UE support,  x20 Static Stance on Airex 4x30 sec Tandem Stance on Airex 4x30 sec SLS Stance on Airex 4x30 sec Marching on Airex  2x30 sec Partial Lunges inside of // bars 2x15 Sit-to-Stands 2x15 Ambulation inside of gym with VC for postural corrections in order to improve balance x400 ft.  Reassessed HEP and progression        Pt. was able to perform exercises without much difficulty. Pt. noted marked fatigue at end of session due to being on feet for longer periods of time. Pt. was able to increase bouts of exercises and is showing improvements of strength. Pt. was spoken with about possible change in plan of care on the next few visits. Pt. notes he would like to come    PT Short Term Goals - 03/23/18 0844      PT SHORT TERM GOAL #1   Title  Pt will be independent with HEP in order to improve strength and balance in order to decrease fall risk and improve function at home and work.     Baseline  Independent with current HEP    Time  2    Period  Weeks    Status  Achieved    Target Date  03/19/18        PT Long Term Goals - 03/19/18 1117      PT LONG TERM GOAL #1   Title  Pt will improve BERG by at least 3 points in order to demonstrate clinically significant improvement in balance.  Baseline  03/19/18: BERG: 54/56    Time  4    Period  Weeks    Status  Achieved    Target Date  03/19/18      PT LONG TERM GOAL #2   Title  Pt will improve DGI by at least 3 points in order to demonstrate clinically significant improvement in balance and decreased risk for falls.    Baseline  02/19/18: DGI: 17/24;  03/19/18: DGI: 22/24    Time  4    Period  Weeks    Status  Achieved    Target Date  03/19/18      PT LONG TERM GOAL #3   Title  Pt will decrease 5TSTS by at least 3 seconds in order to demonstrate clinically significant improvement in LE strength.    Baseline  02/19/18: 5TSTS: 19.43 sec; 03/19/18: 5TSTS: 13.58 sec    Time  4    Period  Weeks    Status  Achieved    Target Date  03/19/18      PT LONG TERM GOAL #4   Title  Pt will decrease TUG to below 14 seconds/decrease 23% in order to  demonstrate decreased fall risk.    Baseline  02/19/18 TUG: 21.16 sec; 03/19/18 TUG: 14.98 sec    Time  4    Period  Weeks    Status  Partially Met      PT LONG TERM GOAL #5   Title  Pt. will be able to ascend 2 stairs without use of UE in order to safely ambulate into house.    Baseline  Unable to ascend 2 stairs without use of UE's.    Time  4    Period  Weeks    Status  New    Target Date  04/16/18         Patient will benefit from skilled therapeutic intervention in order to improve the following deficits and impairments:  Abnormal gait, Decreased endurance, Decreased activity tolerance, Decreased strength, Decreased balance, Decreased mobility, Difficulty walking, Decreased coordination  Visit Diagnosis: Muscle weakness (generalized)  Other abnormalities of gait and mobility  Unsteady gait     Problem List Patient Active Problem List   Diagnosis Date Noted  . Atrial fibrillation with RVR ([place]) 10/27/2011  . HTN (hypertension) 10/23/2011  . Diabetes mellitus ([place]) 10/23/2011  . Acute respiratory failure ([place]) 10/23/2011  . Empyema ([place]) 10/23/2011  . Acute renal failure ([place]) 10/23/2011   Pura Spice, PT, DPT # [id number] Gwenlyn Saran, SPT 03/23/2018, 8:45 AM  [place] [facility] [address] [place], [place], [address] Phone: [phone]   Fax:  [phone]  Name: Peter Nunez MRN: [id number] Date of Birth: [birth year]

## 2018-03-23 NOTE — Addendum Note (Signed)
Addended by: Dorcas Carrow C on: 03/23/2018 09:00 AM   Modules accepted: Orders

## 2018-03-24 ENCOUNTER — Ambulatory Visit: Payer: Medicare HMO | Attending: Nurse Practitioner | Admitting: Physical Therapy

## 2018-03-24 DIAGNOSIS — R2681 Unsteadiness on feet: Secondary | ICD-10-CM | POA: Diagnosis present

## 2018-03-24 DIAGNOSIS — M6281 Muscle weakness (generalized): Secondary | ICD-10-CM | POA: Diagnosis present

## 2018-03-24 DIAGNOSIS — R2689 Other abnormalities of gait and mobility: Secondary | ICD-10-CM | POA: Diagnosis present

## 2018-03-24 NOTE — Therapy (Signed)
[facility] [address] [place], [place], [address] Phone: [phone]   Fax:  [phone]  Physical Therapy Treatment  Patient Details  Name: Peter Nunez MRN: [id number] Date of Birth: 04/18/1941 Referring Provider (PT): Dellia Beckwith, [place]   Encounter Date: 03/24/2018  PT End of Session - 03/24/18 1107    Visit Number  7    Number of Visits  10    Date for PT Re-Evaluation  04/16/18    PT Start Time  1103    PT Stop Time  1200    PT Time Calculation (min)  57 min    Equipment Utilized During Treatment  Gait belt    Activity Tolerance  Patient tolerated treatment well    Behavior During Therapy  [facility] for tasks assessed/performed       Past Medical History:  Diagnosis Date  . Diabetes mellitus ([place])   . HTN (hypertension)   . Hyperlipidemia   . Stroke [facility])     Past Surgical History:  Procedure Laterality Date  . DECORTICATION  10/23/2011   Procedure: DECORTICATION;  Surgeon: Gaye Pollack, MD;  Location: [place];  Service: Thoracic;  Laterality: Right;  . FLEXIBLE BRONCHOSCOPY  10/23/2011   Procedure: FLEXIBLE BRONCHOSCOPY;  Surgeon: Gaye Pollack, MD;  Location: [facility] OR;  Service: Thoracic;  Laterality: N/A;  . PLEURAL EFFUSION DRAINAGE  10/23/2011   Procedure: DRAINAGE OF PLEURAL EFFUSION;  Surgeon: Gaye Pollack, MD;  Location: [place];  Service: Thoracic;  Laterality: Right;  . THORACOTOMY  10/23/2011   Procedure: THORACOTOMY MAJOR;  Surgeon: Gaye Pollack, MD;  Location: [place];  Service: Thoracic;  Laterality: Right;    There were no vitals filed for this visit.  Subjective Assessment - 03/24/18 1103    Subjective  Pt. reports the hardest aspect of walking now has been at night when he wakes from sleep and has to walk to the bathroom in the dark.  Pt. reports he will likely be working on his tractor over the next week.    Pertinent History  CVA, Type 2 Diabetes    Limitations  Walking;Standing    Patient Stated  Goals  Pt. wants to prevent future falls from occurring.    Currently in Pain?  No/denies    Pain Score  0-No pain    Pain Location  Back    Pain Orientation  Right    Pain Descriptors / Indicators  Aching           Treatment:   There Ex.  IT trainer with VC for proper body alignment, fading UE support,x20 Partial Lunges inside of // bars 2x15 Sit-to-Stands 2x15 Ambulation inside of gym with VC for postural corrections in order to improve balance x400 ft. Resisted walking in // bars with one BlackTB (Front/Back/Lateral) x5 each Weighted Standing B Hip Marches 2x15 each, 5# cuff weight Weighted Standing B Hip Extension 2x15 each, 5# cuff weight Weighted Standing B Hip Abduction 2x15 each, 5# cuff weight  All resisted exercises performed with GTB   Reassessed HEP and progression      PT Short Term Goals - 03/23/18 0844      PT SHORT TERM GOAL #1   Title  Pt will be independent with HEP in order to improve strength and balance in order to decrease fall risk and improve function at home and work.     Baseline  Independent with current HEP    Time  2  Period  Weeks    Status  Achieved    Target Date  03/19/18          PT Short Term Goals - 03/23/18 0844      PT SHORT TERM GOAL #1   Title  Pt will be independent with HEP in order to improve strength and balance in order to decrease fall risk and improve function at home and work.     Baseline  Independent with current HEP    Time  2    Period  Weeks    Status  Achieved    Target Date  03/19/18        PT Long Term Goals - 03/19/18 1117      PT LONG TERM GOAL #1   Title  Pt will improve BERG by at least 3 points in order to demonstrate clinically significant improvement in balance.     Baseline  03/19/18: BERG: 54/56    Time  4    Period  Weeks    Status  Achieved    Target Date  03/19/18      PT LONG TERM GOAL #2   Title  Pt will improve DGI by at least 3 points in order to demonstrate  clinically significant improvement in balance and decreased risk for falls.    Baseline  02/19/18: DGI: 17/24;  03/19/18: DGI: 22/24    Time  4    Period  Weeks    Status  Achieved    Target Date  03/19/18      PT LONG TERM GOAL #3   Title  Pt will decrease 5TSTS by at least 3 seconds in order to demonstrate clinically significant improvement in LE strength.    Baseline  02/19/18: 5TSTS: 19.43 sec; 03/19/18: 5TSTS: 13.58 sec    Time  4    Period  Weeks    Status  Achieved    Target Date  03/19/18      PT LONG TERM GOAL #4   Title  Pt will decrease TUG to below 14 seconds/decrease 23% in order to demonstrate decreased fall risk.    Baseline  02/19/18 TUG: 21.16 sec; 03/19/18 TUG: 14.98 sec    Time  4    Period  Weeks    Status  Partially Met      PT LONG TERM GOAL #5   Title  Pt. will be able to ascend 2 stairs without use of UE in order to safely ambulate into house.    Baseline  Unable to ascend 2 stairs without use of UE's.    Time  4    Period  Weeks    Status  New    Target Date  04/16/18         Plan - 03/24/18 1718    Clinical Impression Statement  Pt. continues to demonstrate increased gait mobility and more normalized gait pattern when ambulating in gym.  Pt. requires less verbal cuing for scuffing of feet and is able to perform heel-to-toe gait pattern that assists in preventing future falls.  Pt. is making significant progress and will likely be d/c in the following weeks.  Pt. will continue with skilled therapy at this point to assist with balance righting techniques to prevent future falls.    Clinical Presentation  Evolving    Clinical Decision Making  Moderate    Rehab Potential  Fair    PT Frequency  2x / week    PT Duration  4 weeks  PT Treatment/Interventions  Cryotherapy;Canalith Repostioning;Moist Heat;Electrical Stimulation;Gait training;Stair training;Functional mobility training;Therapeutic activities;Therapeutic exercise;Manual techniques;Patient/family  education;Neuromuscular re-education;Balance training;Passive range of motion;Dry needling;Energy conservation;Taping;Vestibular;Joint Manipulations;Spinal Manipulations;Visual/perceptual remediation/compensation;ADLs/Self Care Home Management    PT Next Visit Plan  give new ther ex at next visit.  speak about potential change in POC.    PT Home Exercise Plan  Continue with HEP       Patient will benefit from skilled therapeutic intervention in order to improve the following deficits and impairments:  Abnormal gait, Decreased endurance, Decreased activity tolerance, Decreased strength, Decreased balance, Decreased mobility, Difficulty walking, Decreased coordination  Visit Diagnosis: Muscle weakness (generalized)  Other abnormalities of gait and mobility  Unsteady gait     Problem List Patient Active Problem List   Diagnosis Date Noted  . Atrial fibrillation with RVR ([place]) 10/27/2011  . HTN (hypertension) 10/23/2011  . Diabetes mellitus ([place]) 10/23/2011  . Acute respiratory failure ([place]) 10/23/2011  . Empyema ([place]) 10/23/2011  . Acute renal failure ([place]) 10/23/2011   Pura Spice, PT, DPT # [place], SPT 03/24/2018, 5:23 PM  [facility] [address] [place], [place], [address] Phone: [phone]   Fax:  [phone]  Name: Peter Nunez MRN: [id number] Date of Birth: [birth year]

## 2018-03-25 ENCOUNTER — Encounter: Payer: Self-pay | Admitting: Physical Therapy

## 2018-03-26 ENCOUNTER — Encounter: Payer: Medicare HMO | Admitting: Physical Therapy

## 2018-03-31 ENCOUNTER — Encounter: Payer: Self-pay | Admitting: Physical Therapy

## 2018-03-31 ENCOUNTER — Ambulatory Visit: Payer: Medicare HMO | Admitting: Physical Therapy

## 2018-03-31 DIAGNOSIS — M6281 Muscle weakness (generalized): Secondary | ICD-10-CM

## 2018-03-31 DIAGNOSIS — R2681 Unsteadiness on feet: Secondary | ICD-10-CM

## 2018-03-31 DIAGNOSIS — R2689 Other abnormalities of gait and mobility: Secondary | ICD-10-CM

## 2018-04-02 ENCOUNTER — Encounter: Payer: Medicare HMO | Admitting: Physical Therapy

## 2018-04-03 NOTE — Therapy (Signed)
[facility] [address] [place], [place], [address] Phone: [phone]   Fax:  [phone]  Physical Therapy Treatment  Patient Details  Name: Peter Nunez MRN: [id number] Date of Birth: [birth year] Referring Provider (PT): Dellia Beckwith, [place]   Encounter Date: 03/31/2018  PT End of Session - 04/03/18 1932    Visit Number  8    Number of Visits  10    Date for PT Re-Evaluation  04/16/18    PT Start Time  1111    PT Stop Time  1205    PT Time Calculation (min)  54 min    Equipment Utilized During Treatment  Gait belt    Activity Tolerance  Patient tolerated treatment well    Behavior During Therapy  [facility] for tasks assessed/performed       Past Medical History:  Diagnosis Date  . Diabetes mellitus ([place])   . HTN (hypertension)   . Hyperlipidemia   . Stroke [facility])     Past Surgical History:  Procedure Laterality Date  . DECORTICATION  10/23/2011   Procedure: DECORTICATION;  Surgeon: Gaye Pollack, MD;  Location: [place];  Service: Thoracic;  Laterality: Right;  . FLEXIBLE BRONCHOSCOPY  10/23/2011   Procedure: FLEXIBLE BRONCHOSCOPY;  Surgeon: Gaye Pollack, MD;  Location: [facility] OR;  Service: Thoracic;  Laterality: N/A;  . PLEURAL EFFUSION DRAINAGE  10/23/2011   Procedure: DRAINAGE OF PLEURAL EFFUSION;  Surgeon: Gaye Pollack, MD;  Location: [place];  Service: Thoracic;  Laterality: Right;  . THORACOTOMY  10/23/2011   Procedure: THORACOTOMY MAJOR;  Surgeon: Gaye Pollack, MD;  Location: [place];  Service: Thoracic;  Laterality: Right;    There were no vitals filed for this visit.  Subjective Assessment - 04/03/18 1920    Subjective  Pt. entered PT with a slight shuffling gait with decrease step length but was able to improve after cuing.  No falls or pain reported.     Pertinent History  CVA, Type 2 Diabetes    Limitations  Walking;Standing    Patient Stated Goals  Pt. wants to prevent future falls from occurring.    Currently in  Pain?  No/denies         Treatment:   There Ex.  Stair Training with VC for proper body alignment.  Recip. Pattern and light handrail use.  PT provides verbal cuing t/o.  Sit-to-Stands 2x15 from green chair (mirror feedback) Ambulation inside of gym with VC for postural corrections in order to improve balance x400 ft. Resisted walking in // bars with one BlackTB (Front/Back/Lateral) x5 each Weighted Standing B Hip Marches 2x15 each, 5# cuff weight Weighted Standing B Hip Extension 2x15 each, 5# cuff weight Weighted Standing B Hip Abduction 2x15 each, 5# cuff weight  Partial lunges/ high marching in //-bars (cuing for upright posture)  No change to HEP at this time.   Pt. Encouraged to increase walking distance   PT Short Term Goals - 03/23/18 0844      PT SHORT TERM GOAL #1   Title  Pt will be independent with HEP in order to improve strength and balance in order to decrease fall risk and improve function at home and work.     Baseline  Independent with current HEP    Time  2    Period  Weeks    Status  Achieved    Target Date  03/19/18        PT Long Term Goals -  03/19/18 1117      PT LONG TERM GOAL #1   Title  Pt will improve BERG by at least 3 points in order to demonstrate clinically significant improvement in balance.     Baseline  03/19/18: BERG: 54/56    Time  4    Period  Weeks    Status  Achieved    Target Date  03/19/18      PT LONG TERM GOAL #2   Title  Pt will improve DGI by at least 3 points in order to demonstrate clinically significant improvement in balance and decreased risk for falls.    Baseline  02/19/18: DGI: 17/24;  03/19/18: DGI: 22/24    Time  4    Period  Weeks    Status  Achieved    Target Date  03/19/18      PT LONG TERM GOAL #3   Title  Pt will decrease 5TSTS by at least 3 seconds in order to demonstrate clinically significant improvement in LE strength.    Baseline  02/19/18: 5TSTS: 19.43 sec; 03/19/18: 5TSTS: 13.58 sec    Time   4    Period  Weeks    Status  Achieved    Target Date  03/19/18      PT LONG TERM GOAL #4   Title  Pt will decrease TUG to below 14 seconds/decrease 23% in order to demonstrate decreased fall risk.    Baseline  02/19/18 TUG: 21.16 sec; 03/19/18 TUG: 14.98 sec    Time  4    Period  Weeks    Status  Partially Met      PT LONG TERM GOAL #5   Title  Pt. will be able to ascend 2 stairs without use of UE in order to safely ambulate into house.    Baseline  Unable to ascend 2 stairs without use of UE's.    Time  4    Period  Weeks    Status  New    Target Date  04/16/18            Plan - 04/03/18 1933    Clinical Impression Statement  Pt. is easily distracted and loses focus during gait/stair training.  No LOB during tx. session and demonstrates improved ability to ascend/descend stairs.  Pt. very upbeat and works hard during tx. session.      Clinical Presentation  Evolving    Clinical Decision Making  Moderate    Rehab Potential  Fair    PT Frequency  2x / week    PT Duration  4 weeks    PT Treatment/Interventions  Cryotherapy;Canalith Repostioning;Moist Heat;Electrical Stimulation;Gait training;Stair training;Functional mobility training;Therapeutic activities;Therapeutic exercise;Manual techniques;Patient/family education;Neuromuscular re-education;Balance training;Passive range of motion;Dry needling;Energy conservation;Taping;Vestibular;Joint Manipulations;Spinal Manipulations;Visual/perceptual remediation/compensation;ADLs/Self Care Home Management    PT Next Visit Plan  Reassess pts. current HEP and progress     PT Home Exercise Plan  Continue with HEP    Consulted and Agree with Plan of Care  Patient       Patient will benefit from skilled therapeutic intervention in order to improve the following deficits and impairments:  Abnormal gait, Decreased endurance, Decreased activity tolerance, Decreased strength, Decreased balance, Decreased mobility, Difficulty walking,  Decreased coordination  Visit Diagnosis: Muscle weakness (generalized)  Other abnormalities of gait and mobility  Unsteady gait     Problem List Patient Active Problem List   Diagnosis Date Noted  . Atrial fibrillation with RVR ([place]) 10/27/2011  . HTN (hypertension) 10/23/2011  .  Diabetes mellitus ([place]) 10/23/2011  . Acute respiratory failure ([place]) 10/23/2011  . Empyema ([place]) 10/23/2011  . Acute renal failure ([place]) 10/23/2011   Pura Spice, PT, DPT # [id number] 04/03/2018, 7:36 PM  [facility] [address] [place], [place], [address] Phone: [phone]   Fax:  [phone]  Name: CORBEN AUZENNE MRN: [id number] Date of Birth: 20-Jul-1940

## 2018-04-07 ENCOUNTER — Encounter: Payer: Self-pay | Admitting: Physical Therapy

## 2018-04-07 ENCOUNTER — Ambulatory Visit: Payer: Medicare HMO | Admitting: Physical Therapy

## 2018-04-07 DIAGNOSIS — M6281 Muscle weakness (generalized): Secondary | ICD-10-CM

## 2018-04-07 DIAGNOSIS — R2689 Other abnormalities of gait and mobility: Secondary | ICD-10-CM

## 2018-04-07 DIAGNOSIS — R2681 Unsteadiness on feet: Secondary | ICD-10-CM

## 2018-04-09 ENCOUNTER — Encounter: Payer: Medicare HMO | Admitting: Physical Therapy

## 2018-04-10 NOTE — Therapy (Signed)
[facility] [address] [place], [place], [address] Phone: [phone]   Fax:  [phone]  Physical Therapy Treatment  Patient Details  Name: Peter Nunez MRN: [id number] Date of Birth: 09/13/1940 Referring Provider (PT): Dellia Beckwith, [place]   Encounter Date: 04/07/2018  PT End of Session - 04/10/18 1118    Visit Number  9    Number of Visits  10    Date for PT Re-Evaluation  04/16/18    PT Start Time  1110    PT Stop Time  1201    PT Time Calculation (min)  51 min    Activity Tolerance  Patient tolerated treatment well    Behavior During Therapy  [facility] for tasks assessed/performed       Past Medical History:  Diagnosis Date  . Diabetes mellitus ([place])   . HTN (hypertension)   . Hyperlipidemia   . Stroke [facility])     Past Surgical History:  Procedure Laterality Date  . DECORTICATION  10/23/2011   Procedure: DECORTICATION;  Surgeon: Gaye Pollack, MD;  Location: [place];  Service: Thoracic;  Laterality: Right;  . FLEXIBLE BRONCHOSCOPY  10/23/2011   Procedure: FLEXIBLE BRONCHOSCOPY;  Surgeon: Gaye Pollack, MD;  Location: [facility] OR;  Service: Thoracic;  Laterality: N/A;  . PLEURAL EFFUSION DRAINAGE  10/23/2011   Procedure: DRAINAGE OF PLEURAL EFFUSION;  Surgeon: Gaye Pollack, MD;  Location: [place];  Service: Thoracic;  Laterality: Right;  . THORACOTOMY  10/23/2011   Procedure: THORACOTOMY MAJOR;  Surgeon: Gaye Pollack, MD;  Location: [place];  Service: Thoracic;  Laterality: Right;    There were no vitals filed for this visit.    Pt. entered PT with new c/o L foot pain/ swelling. Pt. went to ER the other day and diagnosed with Charcot foot due to diabetes. Pt. states the swelling/ redness as improved over past 2 days but MD want pt. to limit activity. Pt. unable to wear CAM boot due to balance/ gait issues.        Treatment:   There Ex.:  No closed chain or wt. Bearing there.ex. Today Supine SLR/ SAQ (mod. Manual  resistance)/ bolster bridging/ hip abd. 20x each Seated LAQ/ marching 20x each Supine L LE elevated on bolster: alphabet ex./ pumps/ circles (new HEP).  Manual tx.:  Supine B LE/ lumbar stretches (15 min.)   PT Education - 04/10/18 1119    Education Details  Added ankle ROM ex./ Alphabets/ SLR    Person(s) Educated  Patient    Methods  Explanation;Demonstration;Handout    Comprehension  Verbalized understanding;Returned demonstration       PT Short Term Goals - 03/23/18 0844      PT SHORT TERM GOAL #1   Title  Pt will be independent with HEP in order to improve strength and balance in order to decrease fall risk and improve function at home and work.     Baseline  Independent with current HEP    Time  2    Period  Weeks    Status  Achieved    Target Date  03/19/18        PT Long Term Goals - 03/19/18 1117      PT LONG TERM GOAL #1   Title  Pt will improve BERG by at least 3 points in order to demonstrate clinically significant improvement in balance.     Baseline  03/19/18: BERG: 54/56    Time  4  Period  Weeks    Status  Achieved    Target Date  03/19/18      PT LONG TERM GOAL #2   Title  Pt will improve DGI by at least 3 points in order to demonstrate clinically significant improvement in balance and decreased risk for falls.    Baseline  02/19/18: DGI: 17/24;  03/19/18: DGI: 22/24    Time  4    Period  Weeks    Status  Achieved    Target Date  03/19/18      PT LONG TERM GOAL #3   Title  Pt will decrease 5TSTS by at least 3 seconds in order to demonstrate clinically significant improvement in LE strength.    Baseline  02/19/18: 5TSTS: 19.43 sec; 03/19/18: 5TSTS: 13.58 sec    Time  4    Period  Weeks    Status  Achieved    Target Date  03/19/18      PT LONG TERM GOAL #4   Title  Pt will decrease TUG to below 14 seconds/decrease 23% in order to demonstrate decreased fall risk.    Baseline  02/19/18 TUG: 21.16 sec; 03/19/18 TUG: 14.98 sec    Time  4    Period   Weeks    Status  Partially Met      PT LONG TERM GOAL #5   Title  Pt. will be able to ascend 2 stairs without use of UE in order to safely ambulate into house.    Baseline  Unable to ascend 2 stairs without use of UE's.    Time  4    Period  Weeks    Status  New    Target Date  04/16/18         Plan - 04/10/18 1118    Clinical Impression Statement  Standing ther.ex./ balance task limited today due to L foot pain/ swelling.  Significant L medial foot/ navicular region tenderness and edema/ redness noted.  Pt. requires assist to don/ doff shoe.  Pt. ambulates with moderate L antalgic gait with decrease stance time during R swing through phase of gait with use of SPC.  PT focused on supine based stretches/ ther.ex.      Clinical Presentation  Evolving    Clinical Decision Making  Moderate    Rehab Potential  Fair    PT Frequency  2x / week    PT Duration  4 weeks    PT Treatment/Interventions  Cryotherapy;Canalith Repostioning;Moist Heat;Electrical Stimulation;Gait training;Stair training;Functional mobility training;Therapeutic activities;Therapeutic exercise;Manual techniques;Patient/family education;Neuromuscular re-education;Balance training;Passive range of motion;Dry needling;Energy conservation;Taping;Vestibular;Joint Manipulations;Spinal Manipulations;Visual/perceptual remediation/compensation;ADLs/Self Care Home Management    PT Next Visit Plan  Reassess L foot/ walking.  Check goals.      PT Home Exercise Plan  Continue with HEP    Consulted and Agree with Plan of Care  Patient       Patient will benefit from skilled therapeutic intervention in order to improve the following deficits and impairments:  Abnormal gait, Decreased endurance, Decreased activity tolerance, Decreased strength, Decreased balance, Decreased mobility, Difficulty walking, Decreased coordination  Visit Diagnosis: Muscle weakness (generalized)  Other abnormalities of gait and mobility  Unsteady  gait     Problem List Patient Active Problem List   Diagnosis Date Noted  . Atrial fibrillation with RVR ([place]) 10/27/2011  . HTN (hypertension) 10/23/2011  . Diabetes mellitus ([place]) 10/23/2011  . Acute respiratory failure ([place]) 10/23/2011  . Empyema ([place]) 10/23/2011  . Acute renal failure ([place]) 10/23/2011  Pura Spice, PT, DPT # [id number] 04/10/2018, 11:26 AM  [facility] [address] [place], [place], [address] Phone: [phone]   Fax:  [phone]  Name: RAEQWON LUX MRN: [id number] Date of Birth: 08-18-1940

## 2018-04-14 ENCOUNTER — Encounter: Payer: Self-pay | Admitting: Physical Therapy

## 2018-04-14 ENCOUNTER — Ambulatory Visit: Payer: Medicare HMO | Admitting: Physical Therapy

## 2018-04-14 DIAGNOSIS — M6281 Muscle weakness (generalized): Secondary | ICD-10-CM | POA: Diagnosis not present

## 2018-04-14 DIAGNOSIS — R2681 Unsteadiness on feet: Secondary | ICD-10-CM

## 2018-04-14 DIAGNOSIS — R2689 Other abnormalities of gait and mobility: Secondary | ICD-10-CM

## 2018-04-14 NOTE — Therapy (Signed)
[facility] [address] [place], [place], [address] Phone: [phone]   Fax:  [phone]  Physical Therapy Treatment  Patient Details  Name: Peter Nunez MRN: [id number] Date of Birth: 1940/11/06 Referring Provider (PT): Dellia Beckwith, [place]   Encounter Date: 04/14/2018  PT End of Session - 04/14/18 1401    Visit Number  10    Number of Visits  10    Date for PT Re-Evaluation  04/16/18    PT Start Time  1111    PT Stop Time  1203    PT Time Calculation (min)  52 min    Activity Tolerance  Patient tolerated treatment well    Behavior During Therapy  [facility] for tasks assessed/performed       Past Medical History:  Diagnosis Date  . Diabetes mellitus ([place])   . HTN (hypertension)   . Hyperlipidemia   . Stroke [facility])     Past Surgical History:  Procedure Laterality Date  . DECORTICATION  10/23/2011   Procedure: DECORTICATION;  Surgeon: Gaye Pollack, MD;  Location: [place];  Service: Thoracic;  Laterality: Right;  . FLEXIBLE BRONCHOSCOPY  10/23/2011   Procedure: FLEXIBLE BRONCHOSCOPY;  Surgeon: Gaye Pollack, MD;  Location: [facility];  Service: Thoracic;  Laterality: N/A;  . PLEURAL EFFUSION DRAINAGE  10/23/2011   Procedure: DRAINAGE OF PLEURAL EFFUSION;  Surgeon: Gaye Pollack, MD;  Location: [place];  Service: Thoracic;  Laterality: Right;  . THORACOTOMY  10/23/2011   Procedure: THORACOTOMY MAJOR;  Surgeon: Gaye Pollack, MD;  Location: [place];  Service: Thoracic;  Laterality: Right;    There were no vitals filed for this visit.  Subjective Assessment - 04/14/18 1109    Subjective  Pt. states his L foot is doing much better.  Pt. has been more active with walking/ HEP since foot has been feeling better.  Pt. entered PT carrying SPC.      Pertinent History  CVA, Type 2 Diabetes    Limitations  Walking;Standing    Patient Stated Goals  Pt. wants to prevent future falls from occurring.    Currently in Pain?  No/denies           Treatment:   There Ex.  Scifit L6 10 min. B UE/LE (no pain reported) Sit-to-Stands 2x15 from green chair (mirror feedback).  Timed STS Standing hip ex. (all planes)- 10x2 each. Step ups/ downs/ recip. Pattern with min. To no UE assist. Partial lunges/ high marching in //-bars (cuing for upright posture)  Neuro:  Ambulation inside of gym with VC for postural corrections in order to improve balance x400 ft. Airex step ups/ overs/ 6" step ups Lateral walking in //-bars with cuing for upright posture.   Turning CW/CCW   PT Short Term Goals - 04/14/18 1404      PT SHORT TERM GOAL #1   Title  Pt will be independent with HEP in order to improve strength and balance in order to decrease fall risk and improve function at home and work.     Baseline  Independent with current HEP    Time  2    Period  Weeks    Status  Achieved    Target Date  03/19/18        PT Long Term Goals - 04/14/18 1404      PT LONG TERM GOAL #1   Title  Pt will improve BERG by at least 3 points in order to demonstrate  clinically significant improvement in balance.     Baseline  03/19/18: BERG: 54/56    Time  4    Period  Weeks    Status  Achieved    Target Date  03/19/18      PT LONG TERM GOAL #2   Title  Pt will improve DGI by at least 3 points in order to demonstrate clinically significant improvement in balance and decreased risk for falls.    Baseline  02/19/18: DGI: 17/24;  03/19/18: DGI: 22/24    Time  4    Period  Weeks    Status  Achieved    Target Date  03/19/18      PT LONG TERM GOAL #3   Title  Pt will decrease 5TSTS by at least 3 seconds in order to demonstrate clinically significant improvement in LE strength.    Baseline  02/19/18: 5TSTS: 19.43 sec; 03/19/18: 5TSTS: 13.58 sec    Time  4    Period  Weeks    Status  Achieved    Target Date  03/19/18      PT LONG TERM GOAL #4   Title  Pt will decrease TUG to below 14 seconds/decrease 23% in order to demonstrate decreased  fall risk.    Baseline  02/19/18 TUG: 21.16 sec; 03/19/18 TUG: 14.98 sec.  11/26: 12.89 sec    Time  4    Period  Weeks    Status  Achieved    Target Date  04/14/18      PT LONG TERM GOAL #5   Title  Pt. will be able to ascend 2 stairs without use of UE in order to safely ambulate into house.    Baseline  Step ups with no UE assist    Time  4    Period  Weeks    Status  Achieved    Target Date  04/14/18            Plan - 04/14/18 1401    Clinical Impression Statement  Pt. has shown consistent progress with LE strengthening/ balance tasks and will continue on an independent basis at this time.  Pt. reports no LOB or falls over past several weeks.  Pt. will contact PT if any regression in physical status noted over next several weeks/ month.  Discharge from PT at this time.      Clinical Presentation  Evolving    Clinical Decision Making  Moderate    Rehab Potential  Fair    PT Frequency  2x / week    PT Duration  4 weeks    PT Treatment/Interventions  Cryotherapy;Canalith Repostioning;Moist Heat;Electrical Stimulation;Gait training;Stair training;Functional mobility training;Therapeutic activities;Therapeutic exercise;Manual techniques;Patient/family education;Neuromuscular re-education;Balance training;Passive range of motion;Dry needling;Energy conservation;Taping;Vestibular;Joint Manipulations;Spinal Manipulations;Visual/perceptual remediation/compensation;ADLs/Self Care Home Management    PT Next Visit Plan  Discharge    PT Home Exercise Plan  Continue with HEP    Consulted and Agree with Plan of Care  Patient       Patient will benefit from skilled therapeutic intervention in order to improve the following deficits and impairments:  Abnormal gait, Decreased endurance, Decreased activity tolerance, Decreased strength, Decreased balance, Decreased mobility, Difficulty walking, Decreased coordination  Visit Diagnosis: Muscle weakness (generalized)  Other abnormalities of gait  and mobility  Unsteady gait     Problem List Patient Active Problem List   Diagnosis Date Noted  . Atrial fibrillation with RVR ([place]) 10/27/2011  . HTN (hypertension) 10/23/2011  . Diabetes mellitus ([place]) 10/23/2011  .  Acute respiratory failure ([place]) 10/23/2011  . Empyema ([place]) 10/23/2011  . Acute renal failure ([place]) 10/23/2011   Pura Spice, PT, DPT # [phone] 04/14/2018, 2:06 PM  [facility] [address] [place], [place], [address] Phone: [phone]   Fax:  [phone]  Name: Peter Nunez MRN: [id number] Date of Birth: 1940/06/19

## 2021-05-03 ENCOUNTER — Encounter: Payer: Self-pay | Admitting: Emergency Medicine

## 2021-05-03 ENCOUNTER — Emergency Department
Admission: EM | Admit: 2021-05-03 | Discharge: 2021-05-17 | Disposition: A | Discharge status: Home / Self Care | Payer: Medicare HMO | Attending: Emergency Medicine | Admitting: Emergency Medicine

## 2021-05-03 DIAGNOSIS — F03918 Unspecified dementia, unspecified severity, with other behavioral disturbance: Secondary | ICD-10-CM | POA: Diagnosis not present

## 2021-05-03 DIAGNOSIS — I639 Cerebral infarction, unspecified: Secondary | ICD-10-CM | POA: Diagnosis not present

## 2021-05-03 DIAGNOSIS — R262 Difficulty in walking, not elsewhere classified: Secondary | ICD-10-CM | POA: Insufficient documentation

## 2021-05-03 DIAGNOSIS — I4891 Unspecified atrial fibrillation: Secondary | ICD-10-CM | POA: Insufficient documentation

## 2021-05-03 DIAGNOSIS — Z7901 Long term (current) use of anticoagulants: Secondary | ICD-10-CM | POA: Diagnosis not present

## 2021-05-03 DIAGNOSIS — U071 COVID-19: Secondary | ICD-10-CM | POA: Diagnosis not present

## 2021-05-03 DIAGNOSIS — G3183 Dementia with Lewy bodies: Secondary | ICD-10-CM | POA: Diagnosis not present

## 2021-05-03 DIAGNOSIS — Z7984 Long term (current) use of oral hypoglycemic drugs: Secondary | ICD-10-CM | POA: Diagnosis not present

## 2021-05-03 DIAGNOSIS — Z79899 Other long term (current) drug therapy: Secondary | ICD-10-CM | POA: Diagnosis not present

## 2021-05-03 DIAGNOSIS — F0150 Vascular dementia without behavioral disturbance: Secondary | ICD-10-CM | POA: Diagnosis not present

## 2021-05-03 DIAGNOSIS — M6281 Muscle weakness (generalized): Secondary | ICD-10-CM | POA: Insufficient documentation

## 2021-05-03 DIAGNOSIS — Y900 Blood alcohol level of less than 20 mg/100 ml: Secondary | ICD-10-CM | POA: Insufficient documentation

## 2021-05-03 DIAGNOSIS — I10 Essential (primary) hypertension: Secondary | ICD-10-CM | POA: Insufficient documentation

## 2021-05-03 DIAGNOSIS — R456 Violent behavior: Secondary | ICD-10-CM | POA: Diagnosis not present

## 2021-05-03 DIAGNOSIS — Z87891 Personal history of nicotine dependence: Secondary | ICD-10-CM | POA: Insufficient documentation

## 2021-05-03 DIAGNOSIS — E119 Type 2 diabetes mellitus without complications: Secondary | ICD-10-CM | POA: Diagnosis not present

## 2021-05-03 DIAGNOSIS — R4689 Other symptoms and signs involving appearance and behavior: Secondary | ICD-10-CM

## 2021-05-03 DIAGNOSIS — U099 Post covid-19 condition, unspecified: Secondary | ICD-10-CM

## 2021-05-03 DIAGNOSIS — Z7982 Long term (current) use of aspirin: Secondary | ICD-10-CM | POA: Insufficient documentation

## 2021-05-03 DIAGNOSIS — Z046 Encounter for general psychiatric examination, requested by authority: Secondary | ICD-10-CM | POA: Diagnosis present

## 2021-05-03 LAB — URINE DRUG SCREEN, QUALITATIVE (ARMC ONLY)
Amphetamines, Ur Screen: NOT DETECTED
Barbiturates, Ur Screen: NOT DETECTED
Benzodiazepine, Ur Scrn: NOT DETECTED
Cannabinoid 50 Ng, Ur ~~LOC~~: NOT DETECTED
Cocaine Metabolite,Ur ~~LOC~~: NOT DETECTED
MDMA (Ecstasy)Ur Screen: NOT DETECTED
Methadone Scn, Ur: NOT DETECTED
Opiate, Ur Screen: NOT DETECTED
Phencyclidine (PCP) Ur S: NOT DETECTED
Tricyclic, Ur Screen: NOT DETECTED

## 2021-05-03 LAB — CBC
HCT: 40.4 % (ref 39.0–52.0)
Hemoglobin: 13.4 g/dL (ref 13.0–17.0)
MCH: 30.4 pg (ref 26.0–34.0)
MCHC: 33.2 g/dL (ref 30.0–36.0)
MCV: 91.6 fL (ref 80.0–100.0)
Platelets: 155 10*3/uL (ref 150–400)
RBC: 4.41 MIL/uL (ref 4.22–5.81)
RDW: 13 % (ref 11.5–15.5)
WBC: 5.5 10*3/uL (ref 4.0–10.5)
nRBC: 0 % (ref 0.0–0.2)

## 2021-05-03 LAB — COMPREHENSIVE METABOLIC PANEL
ALT: 14 U/L (ref 0–44)
AST: 17 U/L (ref 15–41)
Albumin: 5.1 g/dL — ABNORMAL HIGH (ref 3.5–5.0)
Alkaline Phosphatase: 42 U/L (ref 38–126)
Anion gap: 9 (ref 5–15)
BUN: 29 mg/dL — ABNORMAL HIGH (ref 8–23)
CO2: 27 mmol/L (ref 22–32)
Calcium: 9.7 mg/dL (ref 8.9–10.3)
Chloride: 102 mmol/L (ref 98–111)
Creatinine, Ser: 1.41 mg/dL — ABNORMAL HIGH (ref 0.61–1.24)
GFR, Estimated: 51 mL/min — ABNORMAL LOW (ref >60)
Glucose, Bld: 130 mg/dL — ABNORMAL HIGH (ref 70–99)
Potassium: 4.1 mmol/L (ref 3.5–5.1)
Sodium: 138 mmol/L (ref 135–145)
Total Bilirubin: 0.7 mg/dL (ref 0.3–1.2)
Total Protein: 8.4 g/dL — ABNORMAL HIGH (ref 6.5–8.1)

## 2021-05-03 LAB — SALICYLATE LEVEL: Salicylate Lvl: <7 mg/dL — ABNORMAL LOW (ref 7.0–30.0)

## 2021-05-03 LAB — ETHANOL: Alcohol, Ethyl (B): <10 mg/dL (ref <10)

## 2021-05-03 LAB — ACETAMINOPHEN LEVEL: Acetaminophen (Tylenol), Serum: <10 ug/mL — ABNORMAL LOW (ref 10–30)

## 2021-05-03 MED ORDER — OLANZAPINE 5 MG PO TBDP
10.0000 mg | ORAL_TABLET | Freq: Once | ORAL | Status: AC
  Administered 2021-05-03: 10 mg via ORAL
  Filled 2021-05-03: qty 2

## 2021-05-03 NOTE — ED Notes (Signed)
IVC prior to arrival/ All legal paper work on chart

## 2021-05-03 NOTE — ED Provider Notes (Signed)
[facility] Emergency Department Provider Note ____________________________________________   Event Date/Time   First MD Initiated Contact with Patient 05/03/21 2046     (approximate)  I have reviewed the triage vital signs and the nursing notes.  HISTORY  Chief Complaint Psychiatric Evaluation   HPI Peter Nunez is a 80 y.o. malewho presents to the ED for evaluation of his mental health and aggression.  Chart review indicates history of HTN, HLD, DM and stroke.  He is on Eliquis for atrial fibrillation.  History of mixed Lewy body and vascular dementia, seeing [facility] neuro, prescribed Lamictal and Seroquel.  Patient presents to the ED for evaluation of increasing aggressive behavior at home.  He reports feeling fine and his only complaint to me is that he "had children with the wrong woman."  Denies recent illnesses and denies the allegations on his IVC paperwork that he has been aggressive and tearing up his house, threatening his daughter.  History somewhat limited due to his dementia and refusal to participate.  Denies any current pain or recent illnesses.  As indicated below, within the ED course, majority of history is provided by the daughter over the phone.  She reports increasing aggressive behavior with the patient at home over the past couple weeks.  His mood swings rapidly and he is often aggressive, breaking things in the house and accusing her of mistreating him when she has been tried to go out of her way to make accommodations for him and get care in the house.  Past Medical History:  Diagnosis Date   Diabetes mellitus ([place])    HTN (hypertension)    Hyperlipidemia    Stroke [facility])     Patient Active Problem List   Diagnosis Date Noted   Atrial fibrillation with RVR ([place]) 10/27/2011   HTN (hypertension) 10/23/2011   Diabetes mellitus ([place]) 10/23/2011   Acute respiratory failure ([place]) 10/23/2011   Empyema ([place]) 10/23/2011   Acute renal  failure ([place]) 10/23/2011    Past Surgical History:  Procedure Laterality Date   DECORTICATION  10/23/2011   Procedure: DECORTICATION;  Surgeon: Gaye Pollack, MD;  Location: [place];  Service: Thoracic;  Laterality: Right;   FLEXIBLE BRONCHOSCOPY  10/23/2011   Procedure: FLEXIBLE BRONCHOSCOPY;  Surgeon: Gaye Pollack, MD;  Location: [place];  Service: Thoracic;  Laterality: N/A;   PLEURAL EFFUSION DRAINAGE  10/23/2011   Procedure: DRAINAGE OF PLEURAL EFFUSION;  Surgeon: Gaye Pollack, MD;  Location: [place];  Service: Thoracic;  Laterality: Right;   THORACOTOMY  10/23/2011   Procedure: THORACOTOMY MAJOR;  Surgeon: Gaye Pollack, MD;  Location: [place];  Service: Thoracic;  Laterality: Right;    Prior to Admission medications   Medication Sig Start Date End Date Taking? Authorizing Provider  apixaban (ELIQUIS) 5 MG TABS tablet Take 5 mg by mouth 2 (two) times daily.   Yes [provider]  atorvastatin (LIPITOR) 40 MG tablet Take 40 mg by mouth every morning. 04/18/21  Yes [provider]  diltiazem (CARDIZEM CD) 240 MG 24 hr capsule Take 240 mg by mouth daily. 03/09/21  Yes [provider]  glipiZIDE (GLUCOTROL) 5 MG tablet Take 5 mg by mouth 2 (two) times daily. 04/02/21  Yes [provider]  hydrochlorothiazide (HYDRODIURIL) 25 MG tablet Take 25 mg by mouth daily.   Yes [provider]  lamoTRIgine (LAMICTAL) 25 MG tablet Take 75 mg by mouth 2 (two) times daily. 04/30/21  Yes [provider]  latanoprost (XALATAN) 0.005 % ophthalmic solution 1 drop at bedtime. 04/19/21  Yes [provider]  lisinopril (ZESTRIL) 40 MG tablet Take 40 mg by mouth daily. 03/31/21  Yes [provider]  metFORMIN (GLUCOPHAGE) 500 MG tablet Take 500 mg by mouth 2 (two) times daily with a meal.   Yes [provider]  metoprolol succinate (TOPROL-XL) 25 MG 24 hr tablet Take 25 mg by mouth daily. 03/09/21  Yes [provider]  niacin  (NIASPAN) 500 MG CR tablet Take 500 mg by mouth at bedtime.   Yes [provider]  Omega-3 1000 MG CAPS Take 2 capsules by mouth daily.   Yes [provider]  pantoprazole (PROTONIX) 20 MG tablet Take 20 mg by mouth daily. 02/15/21  Yes [provider]  QUEtiapine (SEROQUEL) 25 MG tablet Take 25 mg by mouth 2 (two) times daily. 04/30/21  Yes [provider]  spironolactone (ALDACTONE) 25 MG tablet Take 25 mg by mouth daily. 04/13/21  Yes [provider]  tamsulosin (FLOMAX) 0.4 MG CAPS capsule Take 0.4 mg by mouth every morning. 02/13/21  Yes [provider]  aspirin 81 MG EC tablet aspirin 81 mg tablet,delayed release  Take 1 tablet every day by oral route. Patient not taking: Reported on 05/03/2021 08/22/14   [provider]  clindamycin (CLEOCIN) 300 MG capsule Take 1 capsule (300 mg total) by mouth 3 (three) times daily. Patient not taking: Reported on 05/03/2021 03/22/15   Harvest Dark, MD  donepezil (ARICEPT) 5 MG tablet Take by mouth. Patient not taking: Reported on 05/03/2021 03/12/21   [provider]  traMADol (ULTRAM) 50 MG tablet Take 50 mg by mouth every 6 (six) hours as needed. 01/30/21   [provider]    Allergies Donepezil, Citalopram, Venlafaxine, Bee venom, Methylprednisolone, Percocet [oxycodone-acetaminophen], and Zolpidem tartrate  No family history on file.  Social History Social History   Tobacco Use   Smoking status: Former    Packs/day: 1.00    Years: 20.00    Pack years: 20.00    Types: Cigarettes    Quit date: 10/22/1980    Years since quitting: 40.5  Substance Use Topics   Alcohol use: Yes    Comment: occasional   Drug use: Never    Review of Systems  Difficult to accurately assess due to patient's altered mentation and refusal to participate. ____________________________________________   PHYSICAL EXAM:  VITAL SIGNS: Vitals:   05/03/21 1932  BP: 132/70   Pulse: (!) 54  Resp: 18  Temp: 98.5 F (36.9 C)  SpO2: 98%    Constitutional: Alert and oriented to year, location. Well appearing and in no acute distress. Eyes: Conjunctivae are normal. PERRL. EOMI. Head: Atraumatic. Nose: No congestion/rhinnorhea. Mouth/Throat: Mucous membranes are moist.  Oropharynx non-erythematous. Neck: No stridor. No cervical spine tenderness to palpation. Cardiovascular: Normal rate, regular rhythm. Grossly normal heart sounds.  Good peripheral circulation. Respiratory: Normal respiratory effort.  No retractions. Lungs CTAB. Gastrointestinal: Soft , nondistended, nontender to palpation. No CVA tenderness. Musculoskeletal: No lower extremity tenderness nor edema.  No joint effusions. No signs of acute trauma. Neurologic:  Normal speech and language. No gross focal neurologic deficits are appreciated.  Skin:  Skin is warm, dry and intact. No rash noted. Psychiatric: Mood and affect are normal. Speech and behavior are normal. ____________________________________________   LABS (all labs ordered are listed, but only abnormal results are displayed)  Labs Reviewed  COMPREHENSIVE METABOLIC PANEL - Abnormal; Notable for the following components:  Result Value   Glucose, Bld 130 (*)    BUN 29 (*)    Creatinine, Ser 1.41 (*)    Total Protein 8.4 (*)    Albumin 5.1 (*)    GFR, Estimated 51 (*)    All other components within normal limits  SALICYLATE LEVEL - Abnormal; Notable for the following components:   Salicylate Lvl <0.3 (*)    All other components within normal limits  ACETAMINOPHEN LEVEL - Abnormal; Notable for the following components:   Acetaminophen (Tylenol), Serum <10 (*)    All other components within normal limits  RESP PANEL BY RT-PCR (FLU A&B, COVID) ARPGX2  ETHANOL  CBC  URINE DRUG SCREEN, QUALITATIVE ([facility] ONLY)   ____________________________________________  12 Lead  EKG   ____________________________________________  RADIOLOGY  ED MD interpretation:    Official radiology report(s): No results found.  ____________________________________________   PROCEDURES and INTERVENTIONS  Procedure(s) performed (including Critical Care):  Procedures  Medications  OLANZapine zydis (ZYPREXA) disintegrating tablet 10 mg (has no administration in time range)    ____________________________________________   MDM / ED COURSE   80 year old male with progressive Lewy body dementia and vascular dementia presents to the ED with increasing aggressive behavior requiring psychiatric evaluation for possible [facility] psych placement.  No evidence of medical pathology to preclude psychiatric evaluation and disposition.  Mild prerenal azotemia noted and we will provide some oral water for this.  We will discuss with psychiatry for evaluation.  Clinical Course as of 05/03/21 2202  Thu May 03, 2021  2147 I call daughter, Su Grand. She pulled papers on him. He's confused about when she's there and not there. She had hired him a caregiver because he's a lot to deal with. For the past week or two, he's been "off the wall... I'm scared of him." She provides him his daily AM meds bc concerns that he isn't taking them himself.  Bizarre behavior, rapidly changing mood. She found him today, smearing feces on bathroom walls, threw phone through an outdoor window,   [DS]  2158 The patient has been placed in psychiatric observation due to the need to provide a safe environment for the patient while obtaining psychiatric consultation and evaluation, as well as ongoing medical and medication management to treat the patient's condition.  The patient has been placed under full IVC at this time.   [DS]    Clinical Course User Index [DS] Vladimir Crofts, MD    ____________________________________________   FINAL CLINICAL IMPRESSION(S) / ED DIAGNOSES  Final diagnoses:  Aggressive  behavior     ED Discharge Orders     None        Krystena Reitter Tamala Julian   Note:  This document was prepared using Dragon voice recognition software and may include unintentional dictation errors.    Vladimir Crofts, MD 05/03/21 2202
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
## 2021-05-03 NOTE — ED Notes (Signed)
IVC prior to arrival/ All legal paper work on chart /Consult ordered

## 2021-05-03 NOTE — ED Notes (Addendum)
Pt's belongings placed in bag: coat, shirt, pants, socks, shoes, life alert necklace, and phone. With staff in quad area

## 2021-05-03 NOTE — ED Notes (Signed)
Pt refused sandwich tray because he only wants hot food

## 2021-05-04 DIAGNOSIS — F0150 Vascular dementia without behavioral disturbance: Secondary | ICD-10-CM | POA: Diagnosis present

## 2021-05-04 DIAGNOSIS — I639 Cerebral infarction, unspecified: Secondary | ICD-10-CM | POA: Diagnosis not present

## 2021-05-04 DIAGNOSIS — F03918 Unspecified dementia, unspecified severity, with other behavioral disturbance: Secondary | ICD-10-CM | POA: Diagnosis present

## 2021-05-04 LAB — RESP PANEL BY RT-PCR (FLU A&B, COVID) ARPGX2
Influenza A by PCR: NEGATIVE
Influenza B by PCR: NEGATIVE
SARS Coronavirus 2 by RT PCR: POSITIVE — AB

## 2021-05-04 MED ORDER — SPIRONOLACTONE 25 MG PO TABS
25.0000 mg | ORAL_TABLET | Freq: Every day | ORAL | Status: DC
  Administered 2021-05-04 – 2021-05-17 (×13): 25 mg via ORAL
  Filled 2021-05-04 (×14): qty 1

## 2021-05-04 MED ORDER — TAMSULOSIN HCL 0.4 MG PO CAPS
0.4000 mg | ORAL_CAPSULE | Freq: Every morning | ORAL | Status: DC
  Administered 2021-05-04 – 2021-05-17 (×10): 0.4 mg via ORAL
  Filled 2021-05-04 (×10): qty 1

## 2021-05-04 MED ORDER — PANTOPRAZOLE SODIUM 20 MG PO TBEC
20.0000 mg | DELAYED_RELEASE_TABLET | Freq: Every day | ORAL | Status: DC
  Administered 2021-05-04 – 2021-05-17 (×12): 20 mg via ORAL
  Filled 2021-05-04 (×14): qty 1

## 2021-05-04 MED ORDER — TRAMADOL HCL 50 MG PO TABS
50.0000 mg | ORAL_TABLET | Freq: Four times a day (QID) | ORAL | Status: DC | PRN
  Administered 2021-05-04 – 2021-05-09 (×2): 50 mg via ORAL
  Filled 2021-05-04 (×4): qty 1

## 2021-05-04 MED ORDER — GLIPIZIDE 5 MG PO TABS
5.0000 mg | ORAL_TABLET | Freq: Two times a day (BID) | ORAL | Status: DC
  Administered 2021-05-04 – 2021-05-17 (×22): 5 mg via ORAL
  Filled 2021-05-04 (×29): qty 1

## 2021-05-04 MED ORDER — LAMOTRIGINE 25 MG PO TABS
75.0000 mg | ORAL_TABLET | Freq: Two times a day (BID) | ORAL | Status: DC
  Administered 2021-05-04 – 2021-05-17 (×26): 75 mg via ORAL
  Filled 2021-05-04 (×28): qty 3

## 2021-05-04 MED ORDER — HYDROCHLOROTHIAZIDE 25 MG PO TABS
25.0000 mg | ORAL_TABLET | Freq: Every day | ORAL | Status: DC
  Administered 2021-05-04 – 2021-05-12 (×8): 25 mg via ORAL
  Filled 2021-05-04 (×9): qty 1

## 2021-05-04 MED ORDER — LISINOPRIL 10 MG PO TABS
40.0000 mg | ORAL_TABLET | Freq: Every day | ORAL | Status: DC
  Administered 2021-05-04 – 2021-05-12 (×7): 40 mg via ORAL
  Filled 2021-05-04 (×8): qty 4

## 2021-05-04 MED ORDER — LATANOPROST 0.005 % OP SOLN
1.0000 [drp] | Freq: Every day | OPHTHALMIC | Status: DC
  Administered 2021-05-07 – 2021-05-15 (×6): 1 [drp] via OPHTHALMIC
  Filled 2021-05-04 (×2): qty 2.5

## 2021-05-04 MED ORDER — RISPERIDONE 1 MG PO TABS
0.5000 mg | ORAL_TABLET | Freq: Two times a day (BID) | ORAL | Status: DC
  Administered 2021-05-04 – 2021-05-17 (×26): 0.5 mg via ORAL
  Filled 2021-05-04 (×27): qty 1

## 2021-05-04 MED ORDER — QUETIAPINE FUMARATE 25 MG PO TABS: 25.0000 mg | ORAL_TABLET | Freq: Two times a day (BID) | ORAL | Status: DC

## 2021-05-04 MED ORDER — DILTIAZEM HCL ER COATED BEADS 240 MG PO CP24
240.0000 mg | ORAL_CAPSULE | Freq: Every day | ORAL | Status: DC
  Administered 2021-05-04 – 2021-05-16 (×12): 240 mg via ORAL
  Filled 2021-05-04 (×14): qty 1

## 2021-05-04 MED ORDER — METFORMIN HCL 500 MG PO TABS
500.0000 mg | ORAL_TABLET | Freq: Two times a day (BID) | ORAL | Status: DC
  Administered 2021-05-04 – 2021-05-17 (×23): 500 mg via ORAL
  Filled 2021-05-04 (×23): qty 1

## 2021-05-04 MED ORDER — APIXABAN 5 MG PO TABS
5.0000 mg | ORAL_TABLET | Freq: Two times a day (BID) | ORAL | Status: DC
  Administered 2021-05-04 – 2021-05-17 (×26): 5 mg via ORAL
  Filled 2021-05-04 (×27): qty 1

## 2021-05-04 MED ORDER — ATORVASTATIN CALCIUM 20 MG PO TABS
40.0000 mg | ORAL_TABLET | Freq: Every morning | ORAL | Status: DC
  Administered 2021-05-04 – 2021-05-17 (×13): 40 mg via ORAL
  Filled 2021-05-04 (×12): qty 2

## 2021-05-04 MED ORDER — METOPROLOL SUCCINATE ER 50 MG PO TB24
25.0000 mg | ORAL_TABLET | Freq: Every day | ORAL | Status: DC
  Administered 2021-05-04 – 2021-05-16 (×11): 25 mg via ORAL
  Filled 2021-05-04 (×14): qty 1

## 2021-05-04 NOTE — ED Notes (Signed)
IVC/Consult completed/Rec. Inpt Admit
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
## 2021-05-04 NOTE — BH Assessment (Signed)
Writer and Nurse Practitioner Theodoro Clock L.) spoke with the patient's daughter Milderd Meager [phone]) the patient is psychiatrically cleared and was ready for discharge. Per the daughter, she couldn't pick him up because she's not returning till Sunday (05/06/2021).  Writer called and left a HIPPA Compliant message with daughter Lenna Sciara [phone]), requesting a return phone call.

## 2021-05-04 NOTE — ED Notes (Signed)
Patient provided breakfast tray, sitting upright in bed, able to feed self.
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
## 2021-05-04 NOTE — ED Provider Notes (Signed)
Emergency Medicine Observation Re-evaluation Note  Peter Nunez is a 80 y.o. male, seen on rounds today.  Pt initially presented to the ED for complaints of Psychiatric Evaluation Currently, the patient is resting.  Physical Exam  BP 132/70    Pulse (!) 54    Temp 98.5 F (36.9 C) (Oral)    Resp 18    Wt 95.3 kg    SpO2 98%    BMI 28.48 kg/m  Physical Exam Gen:  No acute distress Resp:  Breathing easily and comfortably, no accessory muscle usage Neuro:  Moving all four extremities, no gross focal neuro deficits Psych:  Resting currently, generally calm when awake  ED Course / MDM  EKG:   I have reviewed the labs performed to date as well as medications administered while in observation.  Recent changes in the last 24 hours include initial evaluation.  Plan  Current plan is for psychiatric disposition recommendations.  Patient is COVID-positive but has no significant time. Peter Nunez is under involuntary commitment.      Hinda Kehr, MD 05/04/21 [phone]
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
## 2021-05-04 NOTE — ED Notes (Signed)
Pt attempting to get out of bed, assisted into bed and given lunch tray.

## 2021-05-04 NOTE — ED Notes (Signed)
IVC rescinded 

## 2021-05-04 NOTE — ED Notes (Signed)
Pt given phone to speak to girlfriend

## 2021-05-04 NOTE — ED Notes (Signed)
IVC rescinded per unit secretary at this time.
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
## 2021-05-04 NOTE — Consult Note (Addendum)
[facility] Psychiat[facility] Consult   Reason for Consult:Psychiatric Evaluation Referring Physician: Dr. 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  I'm tired of my daughter trying to run my life."  80 yo male with a history of dementia.  He got upset with his daughter last night and was "mean".  Today, he is calm and cooperative with positive for COVID.  He denies suicidal/homicidal ideations, hallucinations, and substance use.  Peter Nunez does have insight into his behaviors and frustrations.  His daughter is concerned about recent mood changes and accusations from him that she is mistreating him. Considering his diagnosis of dementia, his behaviors and mood will continue to decompensate unfortunately; memory care may need to be considered.  Medications adjusted to assist with his mood and frustration.  Psychiatrically cleared for discharge.  Caveat:  His daughter reports she told the ED she was going out of town and not returning until Sunday.  "There's no one to come get him."  Per Peter Nunez on admission: Peter Nunez is a 80 y.o. male patient presented to [facility] ED via law enforcement under involuntary commitment status (IVC). The patient denies attacking his daughter due to his dementia diagnosis. The patient states, "I had children with the wrong woman."  He voiced that the kids want to tell him what to do. He shared, "I do not have a problem with him telling me what to do, but it is how to go about it."  He shared his kids are telling him that he does not  want anything and they will sell his house from underneath him. He states, "I do not have an anger issue. I want to be treated with respect."  The patient was seen face-to-face by this provider; the chart was reviewed and consulted with Dr.Smith on 05/03/2021 due to the patient's care. It was discussed with the EDP that the patient does meet the criteria to be admitted to the geriatric psychiatric inpatient unit.  On evaluation, the patient is alert and oriented x 3, calm, cooperative, and mood-congruent with affect.  The patient does not appear to be responding to internal or external stimuli. Neither is the patient presenting with any delusional thinking. The patient denies auditory or visual hallucinations. The patient denies any suicidal, homicidal, or self-harm ideations. The patient is not presenting with any psychotic or paranoid behaviors.  HPI: Per Dr. Tamala Julian, Peter Nunez is a 80 y.o. malewho presents to the ED for evaluation of his mental health and aggression. Chart review indicates history of HTN, HLD, DM and stroke.  He is on Eliquis for atrial fibrillation.  History of mixed Lewy body and vascular dementia, seeing [facility] neuro, prescribed Lamictal and Seroquel. Patient presents to the ED for evaluation of increasing aggressive behavior at home.  He reports feeling fine and his only complaint to me is that he "had children with the wrong woman."  Denies recent illnesses and denies the allegations on his IVC paperwork that he has been aggressive and tearing up his house, threatening his daughter.  History somewhat limited due to his dementia  and refusal to participate.  Denies any current pain or recent illnesses. As indicated below, within the ED course, majority of history is provided by the daughter over the phone.  She reports increasing aggressive behavior with the patient at home over the past couple weeks.  His mood swings rapidly and he is often aggressive, breaking things in the house and  accusing her of mistreating him when she has been tried to go out of her way to make accommodations for him and get care in the house.   Past Psychiatric History:  Stroke [facility])  Risk to Self:   Risk to Others:   Prior Inpatient Therapy:   Prior Outpatient Therapy:    Past Medical History:  Past Medical History:  Diagnosis Date   Diabetes mellitus ([place])    HTN (hypertension)    Hyperlipidemia    Stroke [facility])     Past Surgical History:  Procedure Laterality Date   DECORTICATION  10/23/2011   Procedure: DECORTICATION;  Surgeon: Gaye Pollack, MD;  Location: [place];  Service: Thoracic;  Laterality: Right;   FLEXIBLE BRONCHOSCOPY  10/23/2011   Procedure: FLEXIBLE BRONCHOSCOPY;  Surgeon: Gaye Pollack, MD;  Location: [place];  Service: Thoracic;  Laterality: N/A;   PLEURAL EFFUSION DRAINAGE  10/23/2011   Procedure: DRAINAGE OF PLEURAL EFFUSION;  Surgeon: Gaye Pollack, MD;  Location: [place];  Service: Thoracic;  Laterality: Right;   THORACOTOMY  10/23/2011   Procedure: THORACOTOMY MAJOR;  Surgeon: Gaye Pollack, MD;  Location: [place];  Service: Thoracic;  Laterality: Right;   Family History: No family history on file. Family Psychiatric  History:  Social History:  Social History   Substance and Sexual Activity  Alcohol Use Yes   Comment: occasional     Social History   Substance and Sexual Activity  Drug Use Never    Social History   Socioeconomic History   Marital status: Widowed    Spouse name: Not on file   Number of children: Not on file   Years of education: Not on file   Highest education level: Not on file  Occupational History   Occupation: retired - Equities trader    Occupation: retired Hotel manager   Tobacco Use   Smoking status: Former    Packs/day: 1.00    Years: 20.00    Pack years: 20.00    Types: Cigarettes    Quit date: 10/22/1980    Years since quitting: 40.5   Smokeless tobacco: Not on file  Substance and Sexual Activity   Alcohol use: Yes    Comment:  occasional   Drug use: Never   Sexual activity: Not on file  Other Topics Concern   Not on file  Social History Narrative   Not on file   Social Determinants of Health   Financial Resource Strain: Not on file  Food Insecurity: Not on file  Transportation Needs: Not on file  Physical Activity: Not on file  Stress: Not on file  Social Connections: Not on file   Additional Social History:    Allergies:   Allergies  Allergen Reactions   Donepezil Diarrhea    Severe diarrhea   Citalopram Other (See Comments)    Worsened bradykinesia   Venlafaxine Other (See Comments)    Worsened bradykinesia   Bee Venom     unknown   Methylprednisolone Hives   Percocet [Oxycodone-Acetaminophen] Other (See Comments)    hallucinations   Zolpidem Tartrate     Labs:  Results  for orders placed or performed during the hospital encounter of 05/03/21 (from the past 48 hour(s))  Comprehensive metabolic panel     Status: Abnormal   Collection Time: 05/03/21  7:48 PM  Result Value Ref Range   Sodium 138 135 - 145 mmol/L   Potassium 4.1 3.5 - 5.1 mmol/L   Chloride 102 98 - 111 mmol/L   CO2 27 22 - 32 mmol/L   Glucose, Bld 130 (H) 70 - 99 mg/dL    Comment: Glucose reference range applies only to samples taken after fasting for at least 8 hours.   BUN 29 (H) 8 - 23 mg/dL   Creatinine, Ser 1.41 (H) 0.61 - 1.24 mg/dL   Calcium 9.7 8.9 - 10.3 mg/dL   Total Protein 8.4 (H) 6.5 - 8.1 g/dL   Albumin 5.1 (H) 3.5 - 5.0 g/dL   AST 17 15 - 41 U/L   ALT 14 0 - 44 U/L   Alkaline Phosphatase 42 38 - 126 U/L   Total Bilirubin 0.7 0.3 - 1.2 mg/dL   GFR, Estimated 51 (L) >60 mL/min    Comment: (NOTE) Calculated using the CKD-EPI Creatinine Equation (2021)    Anion gap 9 5 - 15    Comment: Performed at [facility], [address] [place], [place] [address]  Ethanol     Status: None   Collection Time: 05/03/21  7:48 PM  Result Value Ref Range   Alcohol, Ethyl (B) <10 <10 mg/dL    Comment:  (NOTE) Lowest detectable limit for serum alcohol is 10 mg/dL.  For medical purposes only. Performed at [facility], [address] [place], [place] [address]   Salicylate level     Status: Abnormal   Collection Time: 05/03/21  7:48 PM  Result Value Ref Range   Salicylate Lvl <2.9 (L) 7.0 - 30.0 mg/dL    Comment: Performed at [facility], [address] [place], [place] [address]  Acetaminophen level     Status: Abnormal   Collection Time: 05/03/21  7:48 PM  Result Value Ref Range   Acetaminophen (Tylenol), Serum <10 (L) 10 - 30 ug/mL    Comment: (NOTE) Therapeutic concentrations vary significantly. A range of 10-30 ug/mL  may be an effective concentration for many patients. However, some  are best treated at concentrations outside of this range. Acetaminophen concentrations >150 ug/mL at 4 hours after ingestion  and >50 ug/mL at 12 hours after ingestion are often associated with  toxic reactions.  Performed at [facility], [address] [place], [place] [address]   cbc     Status: None   Collection Time: 05/03/21  7:48 PM  Result Value Ref Range   WBC 5.5 4.0 - 10.5 K/uL   RBC 4.41 4.22 - 5.81 MIL/uL   Hemoglobin 13.4 13.0 - 17.0 g/dL   HCT 40.4 39.0 - 52.0 %   MCV 91.6 80.0 - 100.0 fL   MCH 30.4 26.0 - 34.0 pg   MCHC 33.2 30.0 - 36.0 g/dL   RDW 13.0 11.5 - 15.5 %   Platelets 155 150 - 400 K/uL   nRBC 0.0 0.0 - 0.2 %    Comment: Performed at [facility], [address], [place], [place] [address]  Urine Drug Screen, Qualitative     Status: None   Collection Time: 05/03/21 10:10 PM  Result Value Ref Range   Tricyclic, Ur Screen NONE DETECTED NONE DETECTED   Amphetamines, Ur Screen NONE DETECTED NONE DETECTED  MDMA (Ecstasy)Ur Screen NONE DETECTED NONE DETECTED   Cocaine Metabolite,Ur [place] NONE DETECTED NONE DETECTED   Opiate, Ur Screen NONE DETECTED NONE DETECTED   Phencyclidine (PCP) Ur S NONE DETECTED NONE DETECTED    Cannabinoid 50 Ng, Ur [place] NONE DETECTED NONE DETECTED   Barbiturates, Ur Screen NONE DETECTED NONE DETECTED   Benzodiazepine, Ur Scrn NONE DETECTED NONE DETECTED   Methadone Scn, Ur NONE DETECTED NONE DETECTED    Comment: (NOTE) Tricyclics + metabolites, urine    Cutoff 1000 ng/mL Amphetamines + metabolites, urine  Cutoff 1000 ng/mL MDMA (Ecstasy), urine              Cutoff 500 ng/mL Cocaine Metabolite, urine          Cutoff 300 ng/mL Opiate + metabolites, urine        Cutoff 300 ng/mL Phencyclidine (PCP), urine         Cutoff 25 ng/mL Cannabinoid, urine                 Cutoff 50 ng/mL Barbiturates + metabolites, urine  Cutoff 200 ng/mL Benzodiazepine, urine              Cutoff 200 ng/mL Methadone, urine                   Cutoff 300 ng/mL  The urine drug screen provides only a preliminary, unconfirmed analytical test result and should not be used for non-medical purposes. Clinical consideration and professional judgment should be applied to any positive drug screen result due to possible interfering substances. A more specific alternate chemical method must be used in order to obtain a confirmed analytical result. Gas chromatography / mass spectrometry (GC/MS) is the preferred confirm atory method. Performed at [facility], [address] [place], [place] [address]   Resp Panel by RT-PCR (Flu A&B, Covid) Nasopharyngeal Swab     Status: Abnormal   Collection Time: 05/03/21 10:10 PM   Specimen: Nasopharyngeal Swab; Nasopharyngeal(NP) swabs in vial transport medium  Result Value Ref Range   SARS Coronavirus 2 by RT PCR POSITIVE (A) NEGATIVE    Comment: (NOTE) SARS-CoV-2 target nucleic acids are DETECTED.  The SARS-CoV-2 RNA is generally detectable in upper respiratory specimens during the acute phase of infection. Positive results are indicative of the presence of the identified virus, but do not rule out bacterial infection or co-infection with other pathogens  not detected by the test. Clinical correlation with patient history and other diagnostic information is necessary to determine patient infection status. The expected result is Negative.  Fact Sheet for Patients: EntrepreneurPulse.com.au  Fact Sheet for Healthcare Providers: IncredibleEmployment.be  This test is not yet approved or cleared by the [place] FDA and  has been authorized for detection and/or diagnosis of SARS-CoV-2 by FDA under an Emergency Use Authorization (EUA).  This EUA will remain in effect (meaning this test can be used) for the duration of  the COVID-19 declaration under Section 564(b)(1) of the A ct, 21 U.S.C. section 360bbb-3(b)(1), unless the authorization is terminated or revoked sooner.     Influenza A by PCR NEGATIVE NEGATIVE   Influenza B by PCR NEGATIVE NEGATIVE    Comment: (NOTE) The Xpert Xpress SARS-CoV-2/FLU/RSV plus assay is intended as an aid in the diagnosis of influenza from Nasopharyngeal swab specimens and should not be used as a sole basis for treatment. Nasal washings and aspirates are unacceptable for Xpert Xpress SARS-CoV-2/FLU/RSV testing.  Fact Sheet for Patients: EntrepreneurPulse.com.au  Fact Sheet for  Healthcare Providers: IncredibleEmployment.be  This test is not yet approved or cleared by the [place] and has been authorized for detection and/or diagnosis of SARS-CoV-2 by FDA under an Emergency Use Authorization (EUA). This EUA will remain in effect (meaning this test can be used) for the duration of the COVID-19 declaration under Section 564(b)(1) of the Act, 21 U.S.C. section 360bbb-3(b)(1), unless the authorization is terminated or revoked.  Performed at [facility], [address] [place], [place] [address]     Current Facility-Administered Medications  Medication Dose Route Frequency Provider Last Rate Last Admin    apixaban (ELIQUIS) tablet 5 mg  5 mg Oral BID Duffy Bruce, MD       atorvastatin (LIPITOR) tablet 40 mg  40 mg Oral q morning Duffy Bruce, MD       diltiazem (CARDIZEM CD) 24 hr capsule 240 mg  240 mg Oral Daily Duffy Bruce, MD       glipiZIDE (GLUCOTROL) tablet 5 mg  5 mg Oral BID Duffy Bruce, MD       hydrochlorothiazide (HYDRODIURIL) tablet 25 mg  25 mg Oral Daily Duffy Bruce, MD       lamoTRIgine (LAMICTAL) tablet 75 mg  75 mg Oral BID Duffy Bruce, MD       latanoprost (XALATAN) 0.005 % ophthalmic solution 1 drop  1 drop Both Eyes QHS Duffy Bruce, MD       lisinopril (ZESTRIL) tablet 40 mg  40 mg Oral Daily Duffy Bruce, MD       metFORMIN (GLUCOPHAGE) tablet 500 mg  500 mg Oral BID WC Duffy Bruce, MD       metoprolol succinate (TOPROL-XL) 24 hr tablet 25 mg  25 mg Oral Daily Duffy Bruce, MD       pantoprazole (PROTONIX) EC tablet 20 mg  20 mg Oral Daily Duffy Bruce, MD       risperiDONE (RISPERDAL) tablet 0.5 mg  0.5 mg Oral BID Patrecia Pour, NP       spironolactone (ALDACTONE) tablet 25 mg  25 mg Oral Daily Duffy Bruce, MD       tamsulosin (FLOMAX) capsule 0.4 mg  0.4 mg Oral q morning Duffy Bruce, MD       traMADol Veatrice Bourbon) tablet 50 mg  50 mg Oral Q6H PRN Duffy Bruce, MD       Current Outpatient Medications  Medication Sig Dispense Refill   apixaban (ELIQUIS) 5 MG TABS tablet Take 5 mg by mouth 2 (two) times daily.     atorvastatin (LIPITOR) 40 MG tablet Take 40 mg by mouth every morning.     diltiazem (CARDIZEM CD) 240 MG 24 hr capsule Take 240 mg by mouth daily.     glipiZIDE (GLUCOTROL) 5 MG tablet Take 5 mg by mouth 2 (two) times daily.     hydrochlorothiazide (HYDRODIURIL) 25 MG tablet Take 25 mg by mouth daily.     lamoTRIgine (LAMICTAL) 25 MG tablet Take 75 mg by mouth 2 (two) times daily.     latanoprost (XALATAN) 0.005 % ophthalmic solution 1 drop at bedtime.     lisinopril (ZESTRIL) 40 MG tablet Take 40 mg by mouth  daily.     metFORMIN (GLUCOPHAGE) 500 MG tablet Take 500 mg by mouth 2 (two) times daily with a meal.     metoprolol succinate (TOPROL-XL) 25 MG 24 hr tablet Take 25 mg by mouth daily.     niacin (NIASPAN) 500 MG CR tablet Take 500 mg by mouth at  bedtime.     Omega-3 1000 MG CAPS Take 2 capsules by mouth daily.     pantoprazole (PROTONIX) 20 MG tablet Take 20 mg by mouth daily.     QUEtiapine (SEROQUEL) 25 MG tablet Take 25 mg by mouth 2 (two) times daily.     spironolactone (ALDACTONE) 25 MG tablet Take 25 mg by mouth daily.     tamsulosin (FLOMAX) 0.4 MG CAPS capsule Take 0.4 mg by mouth every morning.     aspirin 81 MG EC tablet aspirin 81 mg tablet,delayed release  Take 1 tablet every day by oral route. (Patient not taking: Reported on 05/03/2021)     clindamycin (CLEOCIN) 300 MG capsule Take 1 capsule (300 mg total) by mouth 3 (three) times daily. (Patient not taking: Reported on 05/03/2021) 30 capsule 0   donepezil (ARICEPT) 5 MG tablet Take by mouth. (Patient not taking: Reported on 05/03/2021)     traMADol (ULTRAM) 50 MG tablet Take 50 mg by mouth every 6 (six) hours as needed.      Musculoskeletal: Strength & Muscle Tone: within normal limits Gait & Station: normal Patient leans: N/A  Psychiatric Specialty Exam: Physical Exam Vitals and nursing note reviewed.  Constitutional:      Appearance: Normal appearance.  HENT:     Head: Normocephalic and atraumatic.     Right Ear: External ear normal.     Left Ear: External ear normal.     Nose: Nose normal.  Cardiovascular:     Rate and Rhythm: Bradycardia present.  Pulmonary:     Effort: Pulmonary effort is normal.  Musculoskeletal:     Cervical back: Normal range of motion and neck supple.  Neurological:     Mental Status: He is alert.  Psychiatric:        Attention and Perception: Attention and perception normal.        Mood and Affect: Mood and affect normal.        Speech: Speech normal.        Behavior: Behavior  normal.        Thought Content: Thought content normal.        Cognition and Memory: Memory is impaired.        Judgment: Judgment is impulsive.    Review of Systems  Psychiatric/Behavioral:  Positive for memory loss.   All other systems reviewed and are negative.  Blood pressure 139/71, pulse 85, temperature 98.2 F (36.8 C), temperature source Oral, resp. rate 17, weight 95.3 kg, SpO2 96 %.Body mass index is 28.48 kg/m.  General Appearance: Casual  Eye Contact:  Good  Speech:  Normal Rate  Volume:  Normal  Mood:  Euthymic  Affect:  Blunt  Thought Process:  Coherent and Descriptions of Associations: Intact  Orientation:  Full (Time, Place, and Person)  Thought Content:  WDL and Logical  Suicidal Thoughts:  No  Homicidal Thoughts:  No  Memory:  Immediate;   Fair  Judgement:  Fair  Insight:  Fair  Psychomotor Activity:  Normal  Concentration:  Concentration: Good and Attention Span: Good  Recall:  [place] of Knowledge:  Good  Language:  Good  Akathisia:  No  Handed:  Right  AIMS (if indicated):     Assets:  Housing Leisure Time Resilience Social Support  ADL's:  Intact  Cognition:  Impaired,  Mild  Sleep:      cla   Physical Exam: Physical Exam Vitals and nursing note reviewed.  Constitutional:  Appearance: Normal appearance.  HENT:     Head: Normocephalic and atraumatic.     Right Ear: External ear normal.     Left Ear: External ear normal.     Nose: Nose normal.  Cardiovascular:     Rate and Rhythm: Bradycardia present.  Pulmonary:     Effort: Pulmonary effort is normal.  Musculoskeletal:     Cervical back: Normal range of motion and neck supple.  Neurological:     Mental Status: He is alert.  Psychiatric:        Attention and Perception: Attention and perception normal.        Mood and Affect: Mood and affect normal.        Speech: Speech normal.        Behavior: Behavior normal.        Thought Content: Thought content normal.         Cognition and Memory: Memory is impaired.        Judgment: Judgment is impulsive.   Review of Systems  Psychiatric/Behavioral:  Positive for memory loss.   All other systems reviewed and are negative. Blood pressure 139/71, pulse 85, temperature 98.2 F (36.8 C), temperature source Oral, resp. rate 17, weight 95.3 kg, SpO2 96 %. Body mass index is 28.48 kg/m.  Treatment Plan Summary: Dementia with behavioral changes: -Changed Seroquel 25 mg BID to Risperdal 0.5 mg BID -Continue Lamictal 75 mg BID per his provider -follow up with outpatient provider  Disposition: Psychiatrically stable  Waylan Boga, NP 05/04/2021 11:31 AM

## 2021-05-05 MED ORDER — ACETAMINOPHEN 500 MG PO TABS: 1000.0000 mg | ORAL_TABLET | Freq: Once | ORAL | Status: AC

## 2021-05-05 MED ORDER — ACETAMINOPHEN 325 MG PO TABS
650.0000 mg | ORAL_TABLET | Freq: Once | ORAL | Status: AC
  Administered 2021-05-05: 650 mg via ORAL
  Filled 2021-05-05: qty 2

## 2021-05-05 MED ORDER — ACETAMINOPHEN 500 MG PO TABS
ORAL_TABLET | ORAL | Status: AC
  Administered 2021-05-05: 1000 mg via ORAL
  Filled 2021-05-05: qty 2

## 2021-05-05 NOTE — ED Notes (Signed)
Pt noted to have urinated on self. Pt requiring total dependence for brief change. Pt able to roll onto left side independently but requiring total assistance when rolling onto right side due to previous deficits. Pt cleaned and brief changed. Pt repositioned in bed. Pt given phone to call daughter back at this time. Pt voices concern of status of his dog at home.

## 2021-05-05 NOTE — ED Provider Notes (Signed)
Emergency Medicine Observation Re-evaluation Note  Peter Nunez is a 80 y.o. male, seen on rounds today.  Pt initially presented to the ED for complaints of Psychiatric Evaluation Currently, the patient is resting.  Physical Exam  BP 108/71 (BP Location: Right Arm)    Pulse 72    Temp 98 F (36.7 C) (Oral)    Resp 17    Wt 95.3 kg    SpO2 95%    BMI 28.48 kg/m  Physical Exam Gen:  No acute distress Resp:  Breathing easily and comfortably, no accessory muscle usage Neuro:  Moving all four extremities, no gross focal neuro deficits Psych:  Resting currently, calm when awake  ED Course / MDM  EKG:   I have reviewed the labs performed to date as well as medications administered while in observation.  Recent changes in the last 24 hours include no significant changes.  Plan  Current plan is for inpatient psychiatric treatment after cleared from [facility].  Peter Nunez is not under involuntary commitment.     Hinda Kehr, MD 05/05/21 [phone]

## 2021-05-06 NOTE — ED Notes (Signed)
Tech received vital signs Pt sleeping at his time and Breakfast tray needs to be reordered

## 2021-05-06 NOTE — NC FL2 (Signed)
[place] LEVEL OF CARE SCREENING TOOL     IDENTIFICATION  Patient Name: Peter Nunez Birthdate: 04-14-1941 Sex: male Admission Date (Current Location): 05/03/2021  [facility] and [place] Number:  Engineering geologist and Address:  [facility], [address] [place], [place] [address]      Provider Number: [id number]  Attending Physician Name and Address:  No att. 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[place]) 05/04/2021   Stroke ([place]) 05/04/2021   Vascular dementia ([place]) 05/04/2021   Dementia with behavioral disturbance 05/04/2021   Atrial fibrillation with RVR ([place]) 10/27/2011   HTN (hypertension) 10/23/2011   Diabetes mellitus ([place]) 10/23/2011   Acute respiratory failure ([place]) 10/23/2011   Empyema ([place]) 10/23/2011   Acute renal failure ([place]) 10/23/2011    Orientation RESPIRATION BLADDER Height & Weight     Self, Place, Situation  Normal Continent Weight: 210 lb (95.3 kg) Height:     BEHAVIORAL SYMPTOMS/MOOD NEUROLOGICAL BOWEL NUTRITION STATUS  Dangerous to self, others or property (has damaged property at home)   Continent Diet  AMBULATORY STATUS COMMUNICATION OF NEEDS Skin   Limited Assist Verbally Normal                       Personal Care Assistance Level of Assistance  Bathing, Feeding, Total care, Dressing Bathing Assistance: Independent Feeding assistance: Independent Dressing Assistance: Independent Total Care Assistance: Limited assistance   Functional Limitations Info  Sight, Hearing, Speech  Sight Info: Adequate Hearing Info: Adequate Speech Info: Adequate    SPECIAL CARE FACTORS FREQUENCY                       Contractures Contractures Info: Not present    Additional Factors Info  Psychotropic               Current Medications (05/06/2021):  This is the current hospital active medication list Current Facility-Administered Medications  Medication Dose Route Frequency Provider Last Rate Last Admin   apixaban (ELIQUIS) tablet 5 mg  5 mg Oral BID Duffy Bruce, MD   5 mg at 05/06/21 1109   atorvastatin (LIPITOR) tablet 40 mg  40 mg Oral q morning Duffy Bruce, MD   40 mg at 05/06/21 1229   diltiazem (CARDIZEM CD) 24 hr capsule 240 mg  240 mg Oral Daily Duffy Bruce, MD   240 mg at 05/06/21 1108   glipiZIDE (GLUCOTROL) tablet 5 mg  5 mg Oral BID WC Duffy Bruce, MD   5 mg at 05/06/21 1108   hydrochlorothiazide (HYDRODIURIL) tablet 25 mg  25 mg Oral Daily Duffy Bruce, MD   25 mg at 05/06/21 1109   lamoTRIgine (LAMICTAL) tablet 75 mg  75 mg Oral BID Duffy Bruce, MD   75 mg at 05/06/21 1110   latanoprost (XALATAN) 0.005 % ophthalmic solution 1 drop  1 drop Both Eyes QHS Duffy Bruce, MD       lisinopril (ZESTRIL) tablet 40 mg  40 mg Oral Daily Duffy Bruce, MD   40 mg at 05/06/21 1109  metFORMIN (GLUCOPHAGE) tablet 500 mg  500 mg Oral BID WC Duffy Bruce, MD   500 mg at 05/06/21 1109   metoprolol succinate (TOPROL-XL) 24 hr tablet 25 mg  25 mg Oral Daily Duffy Bruce, MD   25 mg at 05/06/21 1109   pantoprazole (PROTONIX) EC tablet 20 mg  20 mg Oral Daily Duffy Bruce, MD   20 mg at 05/06/21 1109   risperiDONE (RISPERDAL) tablet 0.5 mg  0.5 mg Oral BID Patrecia Pour, NP   0.5 mg at 05/06/21 1109   spironolactone (ALDACTONE) tablet 25 mg  25 mg Oral Daily Duffy Bruce, MD   25 mg at 05/06/21 1109   tamsulosin (FLOMAX) capsule 0.4 mg  0.4 mg Oral q morning Duffy Bruce, MD   0.4 mg at 05/06/21 1229   traMADol (ULTRAM) tablet 50  mg  50 mg Oral Q6H PRN Duffy Bruce, MD   50 mg at 05/04/21 1405   Current Outpatient Medications  Medication Sig Dispense Refill   apixaban (ELIQUIS) 5 MG TABS tablet Take 5 mg by mouth 2 (two) times daily.     atorvastatin (LIPITOR) 40 MG tablet Take 40 mg by mouth every morning.     diltiazem (CARDIZEM CD) 240 MG 24 hr capsule Take 240 mg by mouth daily.     glipiZIDE (GLUCOTROL) 5 MG tablet Take 5 mg by mouth 2 (two) times daily.     hydrochlorothiazide (HYDRODIURIL) 25 MG tablet Take 25 mg by mouth daily.     lamoTRIgine (LAMICTAL) 25 MG tablet Take 75 mg by mouth 2 (two) times daily.     latanoprost (XALATAN) 0.005 % ophthalmic solution 1 drop at bedtime.     lisinopril (ZESTRIL) 40 MG tablet Take 40 mg by mouth daily.     metFORMIN (GLUCOPHAGE) 500 MG tablet Take 500 mg by mouth 2 (two) times daily with a meal.     metoprolol succinate (TOPROL-XL) 25 MG 24 hr tablet Take 25 mg by mouth daily.     niacin (NIASPAN) 500 MG CR tablet Take 500 mg by mouth at bedtime.     Omega-3 1000 MG CAPS Take 2 capsules by mouth daily.     pantoprazole (PROTONIX) 20 MG tablet Take 20 mg by mouth daily.     QUEtiapine (SEROQUEL) 25 MG tablet Take 25 mg by mouth 2 (two) times daily.     spironolactone (ALDACTONE) 25 MG tablet Take 25 mg by mouth daily.     tamsulosin (FLOMAX) 0.4 MG CAPS capsule Take 0.4 mg by mouth every morning.     aspirin 81 MG EC tablet aspirin 81 mg tablet,delayed release  Take 1 tablet every day by oral route. (Patient not taking: Reported on 05/03/2021)     clindamycin (CLEOCIN) 300 MG capsule Take 1 capsule (300 mg total) by mouth 3 (three) times daily. (Patient not taking: Reported on 05/03/2021) 30 capsule 0   donepezil (ARICEPT) 5 MG tablet Take by mouth. (Patient not taking: Reported on 05/03/2021)     traMADol (ULTRAM) 50 MG tablet Take 50 mg by mouth every 6 (six) hours as needed.       Discharge Medications: Please see discharge summary for a list of discharge  medications.  Relevant Imaging Results:  Relevant Lab Results:   Additional Information SS# [id number]  Adelene Amas, LCSWA

## 2021-05-06 NOTE — ED Notes (Signed)
Pt used 2 person assist to stand and use urinal

## 2021-05-06 NOTE — ED Notes (Signed)
Pt wet brief and bed sheet. RN Dawn and NT [employer] changed soiled linen, socks and brief.

## 2021-05-06 NOTE — ED Notes (Addendum)
Reference Charge RN Levada Dy note. This RN present for conversation. Pt still requiring assistance to walk to bathroom as patient has shuffling gait. Pt also still having hallucinations that other family members have been in room visiting him when they have not. Daughter at bedside with patient at this time and updated on plan. MD Quentin Cornwall aware and updated on disposition.

## 2021-05-06 NOTE — ED Notes (Signed)
This RN met with patient daughter to discuss her concerns about patient being discharged. Pt dtr states that she was told that patient would be admitted for geri psych and then the next day was told that he was ready to be discharged. Pt dtr states that she was told by case manager that pt would not be sent home and they would be finding placement for him. Pt dtr states she did hire a caregiver for him, however, she did not up on Thursday and she has not been able to get in touch with as she is not returning calls or text. Annie, RN states that pt has needed assistance with ADLs while here. Pt lives alone. This RN spoke with AC Jacki who states that given the fact that family has not been able to get in contact with the caregiver and that patient lives alone and requires assistance that we cannot provide for a safe discharge. ° °Patient daughter informed that pt will not be discharged due to unsafe discharge and that TOC will be reconsulted tomorrow morning.  °

## 2021-05-06 NOTE — ED Notes (Addendum)
Snack and drink was given to pt

## 2021-05-06 NOTE — ED Provider Notes (Signed)
Emergency Medicine Observation Re-evaluation Note  Peter Nunez is a 81 y.o. male, seen on rounds today.  Pt initially presented to the ED for complaints of Psychiatric Evaluation Currently, the patient is resting.  Physical Exam  BP (!) 99/59 (BP Location: Right Wrist)    Pulse (!) 50    Temp 97.9 F (36.6 C) (Oral)    Resp 16    Wt 95.3 kg    SpO2 98%    BMI 28.48 kg/m  Physical Exam Gen:  No acute distress Resp:  Breathing easily and comfortably, no accessory muscle usage Neuro:  Moving all four extremities, no gross focal neuro deficits Psych:  Resting currently, calm when awake  ED Course / MDM  EKG:   I have reviewed the labs performed to date as well as medications administered while in observation.  Recent changes in the last 24 hours include no major changes.  Plan  Current plan is for psychiatric placement once cleared from COVID-19.  Peter Nunez is not under involuntary commitment.     Hinda Kehr, MD 05/06/21 [phone]

## 2021-05-06 NOTE — TOC Progression Note (Addendum)
Transition of Care [facility]) - Progression Note    Patient Details  Name: Peter Nunez MRN: [id number] Date of Birth: 27-Apr-1941  Transition of Care [facility]) CM/SW Rich, [place] Phone Number: [phone] 05/06/2021, 4:39 PM  Clinical Narrative:     CSW spoke with patient's daughter [name],[name] (Daughter) [phone] , she was under the impression the patient would stay at [facility] ED until he found ALF placement.  CSW stated the patient has safe discharge, he can return home, and Danielle from [facility] will continue to assist with ALF placement.  CSW created [facility] for Ms. Hardie Shackleton to take home when arrives to take the patient home. Ms.[name] stated she would either pick up the patient this evening or tomorrow morning. CSW stated a copy of the FL2 would be available with the AVS paperwork. Ms. Hardie Shackleton verbalized understanding. EDP/EDT Staff updated on discharge plan.    Expected Discharge Plan: Memory Care Barriers to Discharge: Other (must enter comment) (family will not pick up and COVID +)  Expected Discharge Plan and Services Expected Discharge Plan: Memory Care   Discharge Planning Services: CM Consult   Living arrangements for the past 2 months: Single Family Home                 DME Arranged: N/A DME Agency: NA       HH Arranged: NA HH Agency: NA         Social Determinants of Health (SDOH) Interventions    Readmission Risk Interventions No flowsheet data found.

## 2021-05-06 NOTE — ED Notes (Signed)
Patient assisted to stand and assisted with urinal. Pt able to reposition self in bed with 1 person assist. Pt given snack at this time.

## 2021-05-06 NOTE — ED Notes (Signed)
VOL/pending placement 

## 2021-05-07 MED ORDER — ACETAMINOPHEN 325 MG PO TABS
650.0000 mg | ORAL_TABLET | Freq: Once | ORAL | Status: AC
  Administered 2021-05-07: 19:00:00 650 mg via ORAL
  Filled 2021-05-07: qty 2

## 2021-05-07 NOTE — ED Notes (Signed)
Pt got out of bed and standing at doorway, disoriented. Reoriented to where he is and assisted back to bed by RN and ED Tech. Pt agrees that he will not get out of bed anymore. Fall alarm and pad placed on bed.

## 2021-05-07 NOTE — ED Notes (Signed)
Assisted to stand and use urinal. Pt requires standby assist.

## 2021-05-07 NOTE — ED Notes (Signed)
Pt requesting to get back in the bed. Pt states that his bottom is sore and red, states that is burning. This RN placed barrier cream on pt's bottom. Pt is clean and dry at this time. Pt able to stand and pivot with standby assistance.

## 2021-05-07 NOTE — ED Notes (Signed)
Pt requesting tylenol for his headache. Dr. Jacqualine Code, MD made aware.

## 2021-05-07 NOTE — ED Notes (Addendum)
Pt whistling to get RN or staff attention. Pt at the end of the bed stating he was scared. Reoriented pt to what was going on. Pt soiled. Pt cleaned and dried and placed in recliner. Bed clean and dry at this time.   Pt also transferred from the bed to the recliner. Pt is able to stand and ambulate with 2 person moderate assistance. Bed alarm placed in recliner chair at this time.

## 2021-05-07 NOTE — ED Provider Notes (Signed)
----------------------------------------- °  5:20 AM on 05/07/2021 -----------------------------------------   Blood pressure (!) 159/76, pulse 67, temperature 98.2 F (36.8 C), temperature source Oral, resp. rate 18, weight 95.3 kg, SpO2 96 %.  The patient is calm and cooperative at this time.  There have been no acute events since the last update.  Awaiting disposition plan from social worker.   Paulette Blanch, MD 05/07/21 [phone]

## 2021-05-07 NOTE — TOC Progression Note (Signed)
Transition of Care [facility]) - Progression Note    Patient Details  Name: Peter Nunez MRN: [id number] Date of Birth: Nov 02, 1940  Transition of Care [facility]) CM/SW Contact  Shelbie Hutching, RN Phone Number: 05/07/2021, 2:01 PM  Clinical Narrative:    Daughter expresses that she has been unable to get in touch with patient's caregiver after trying all weekend.  Daughter reports that patient will not be safe at home unless care is arranged.  Danielle with [employer] is working on arranging in home care.  Once care arranged in home patient can return home while [employer] continues to work on placement.     Expected Discharge Plan: Memory Care Barriers to Discharge: Other (must enter comment) (family will not pick up and COVID +)  Expected Discharge Plan and Services Expected Discharge Plan: Memory Care   Discharge Planning Services: CM Consult   Living arrangements for the past 2 months: Single Family Home                 DME Arranged: N/A DME Agency: NA       HH Arranged: NA HH Agency: NA         Social Determinants of Health (SDOH) Interventions    Readmission Risk Interventions No flowsheet data found.

## 2021-05-08 MED ORDER — ACETAMINOPHEN 325 MG PO TABS
650.0000 mg | ORAL_TABLET | Freq: Once | ORAL | Status: AC
  Administered 2021-05-08: 11:00:00 650 mg via ORAL
  Filled 2021-05-08: qty 2

## 2021-05-08 NOTE — ED Notes (Signed)
VOL/ awaiting social work dispo

## 2021-05-08 NOTE — ED Notes (Signed)
Snack and drink given 

## 2021-05-08 NOTE — TOC Progression Note (Signed)
Transition of Care [facility]) - Progression Note    Patient Details  Name: DEZMAN GRANDA MRN: [id number] Date of Birth: 07/04/1940  Transition of Care [facility]) CM/SW Contact  Shelbie Hutching, RN Phone Number: 05/08/2021, 12:26 PM  Clinical Narrative:    RNCM spoke with Danielle with [employer] and she has arranged home care, the home care provider will be reaching out to the daughter today.  Andee Poles will also be reaching out to the daughter today.  RNCM will follow up with daughter later this afternoon.  AT this time the daughter has not heard from the home care providers.     Expected Discharge Plan: Memory Care Barriers to Discharge: Other (must enter comment) (family will not pick up and COVID +)  Expected Discharge Plan and Services Expected Discharge Plan: Memory Care   Discharge Planning Services: CM Consult   Living arrangements for the past 2 months: Single Family Home                 DME Arranged: N/A DME Agency: NA       HH Arranged: NA HH Agency: NA         Social Determinants of Health (SDOH) Interventions    Readmission Risk Interventions No flowsheet data found.

## 2021-05-08 NOTE — ED Notes (Signed)
Pt moved to hospital bed for comfort.

## 2021-05-08 NOTE — ED Provider Notes (Signed)
Emergency Medicine Observation Re-evaluation Note  FINNEGAN GATTA is a 80 y.o. male, seen on rounds today.  Pt initially presented to the ED for complaints of Psychiatric Evaluation Currently, the patient is resting comfortably.  Physical Exam  BP 114/62 (BP Location: Left Arm)    Pulse 61    Temp 98.1 F (36.7 C) (Oral)    Resp 20    Wt 95.3 kg    SpO2 96%    BMI 28.48 kg/m  Physical Exam General: No acute distress Cardiac: Well-perfused extremities Lungs: No respiratory distress Psych: Appropriate mood and affect  ED Course / MDM  EKG:   I have reviewed the labs performed to date as well as medications administered while in observation.  Recent changes in the last 24 hours include none.  Plan  Current plan is for placement.  EDIN KON is not under involuntary commitment.     Naaman Plummer, MD 05/08/21 [phone]

## 2021-05-08 NOTE — ED Notes (Signed)
Dinner tray delivered.

## 2021-05-08 NOTE — ED Notes (Signed)
Pt not wanting to get out of bed today. Reports legs feel too weak today. This RN assisted patient onto bedpan and had to assist patient with peri-care. Pt had small bowel movement. New brief applied and patient repositioned in bed.

## 2021-05-08 NOTE — ED Provider Notes (Signed)
Emergency Medicine Observation Re-evaluation Note  Peter Nunez is a 80 y.o. male, seen on rounds today.  Pt initially presented to the ED for complaints of Psychiatric Evaluation Currently, the patient is resting calmly without complaints.   Physical Exam  BP (!) 121/47 (BP Location: Left Wrist)    Pulse (!) 56    Temp 98.1 F (36.7 C) (Oral)    Resp 20    Wt 95.3 kg    SpO2 94%    BMI 28.48 kg/m  Physical Exam General: no acute distress Lungs: normal effort  Psych: calm  ED Course / MDM  EKG:   I have reviewed the labs performed to date as well as medications administered while in observation.  Recent changes in the last 24 hours include none.  Plan  Current plan is for social work diso.  ARIS EVEN is not under involuntary commitment.     Lucrezia Starch, MD 05/08/21 [phone]

## 2021-05-08 NOTE — ED Notes (Addendum)
Patient sleeping. Will check vitals when patient wakes up.

## 2021-05-08 NOTE — ED Notes (Signed)
Daughter at bedside to visit

## 2021-05-08 NOTE — ED Notes (Signed)
VOL/pending placement 

## 2021-05-08 NOTE — ED Notes (Signed)
Pt placed on bedpan

## 2021-05-08 NOTE — ED Notes (Signed)
Pt had bowel movement, bedpan removed and linens replaced.

## 2021-05-08 NOTE — ED Notes (Signed)
Patient urinated on self. This RN assisted patient with pericare. New brief placed and patient repositioned in bed

## 2021-05-09 MED ORDER — ACETAMINOPHEN 325 MG PO TABS
650.0000 mg | ORAL_TABLET | Freq: Once | ORAL | Status: AC
  Administered 2021-05-09: 04:00:00 650 mg via ORAL
  Filled 2021-05-09: qty 2

## 2021-05-09 NOTE — ED Notes (Addendum)
Pt calling out, laying in bed without gown or covers on, urinal in floor. Pt assisted to chair beside of bed with staff x 2.  Pt able to shuffle and pivot with assistance. Pt stood to use urinal.  Pt cleaned, cream applied to buttocks and linen changed. Pt assisted back to bed, resting quietly, NAD noted.  Pt asking for tylenol for headache, MD aware.

## 2021-05-09 NOTE — Progress Notes (Signed)
PT Cancellation Note  Patient Details Name: Peter Nunez MRN: [id number] DOB: 1941-05-19   Cancelled Treatment:    Reason Eval/Treat Not Completed: Other (comment). Pt speaks only a few words to PT. Followed one command to wiggle toes with multiple cues. When PT attempted to help pt bend his knee, became agitated. Did not speak further to PT. PT to re-attempt as able.  Lieutenant Diego PT, DPT 11:41 AM,05/09/21

## 2021-05-09 NOTE — TOC Progression Note (Signed)
Transition of Care [facility]) - Progression Note    Patient Details  Name: HUMBERT MOROZOV MRN: [id number] Date of Birth: [birth year]  [facility]) CM/SW Contact  Shelbie Hutching, RN Phone Number: 05/09/2021, 5:07 PM  Clinical Narrative:     Milderd Meager will meet with the [facility] team to assess the patient this evening at the hospital.  Milderd Meager says that the agency is requesting up front money that she does not have right now.  TOC will follow up tomorrow.    Expected Discharge Plan: Memory Care Barriers to Discharge: Other (must enter comment) (family will not pick up and COVID +)  Expected Discharge Plan and Services Expected Discharge Plan: Memory Care   Discharge Planning Services: CM Consult   Living arrangements for the past 2 months: Single Family Home                 DME Arranged: N/A DME Agency: NA       HH Arranged: NA HH Agency: NA         Social Determinants of Health (SDOH) Interventions    Readmission Risk Interventions No flowsheet data found.

## 2021-05-09 NOTE — ED Notes (Signed)
Pt requesting pain medication but will not cooperate w/ pain score and location questions.  Also, Pt noted to be incontinent of urine and stool.   Pt reports he cannot stand and cannot call out in time for staff to provide a urinal or bedpan.

## 2021-05-09 NOTE — ED Notes (Signed)
VOL  PENDING  PLACEMENT 

## 2021-05-09 NOTE — ED Notes (Signed)
After going over all medication w/ Pt and scanning them, Pt decided he was no longer going to take them.  Pt reported "he didn't know what they were" and stated that this writer is "stupid."  Attempted to go over medications w/ Pt again.  Pt took pills from this writer and threw them at the wall.   Lunch tray at bedside.  This Probation officer offered to position Pt so he could eat lunch and he refused.  Tray remains at bedside and calllight with reach.

## 2021-05-09 NOTE — ED Provider Notes (Signed)
----------------------------------------- °  6:33 AM on 05/09/2021 -----------------------------------------   Blood pressure 129/60, pulse 62, temperature 97.7 F (36.5 C), temperature source Oral, resp. rate 18, weight 95.3 kg, SpO2 96 %.  The patient is calm and cooperative at this time.  There have been no acute events since the last update.  Awaiting disposition plan from social worker.   Paulette Blanch, MD 05/09/21 [phone]

## 2021-05-09 NOTE — ED Notes (Addendum)
Pt is continually calling staff into room.  When staff offer help, Pt is rude and belittling.  Continues to refuse lunch tray.

## 2021-05-09 NOTE — ED Notes (Signed)
Family came to visit Pt and got him to eat 80% of his lunch.

## 2021-05-09 NOTE — ED Notes (Signed)
Pt called out and asked for this writer "to kick all the people out of his room so he can rest."  No one noted in room.  Light turned off per request.

## 2021-05-09 NOTE — ED Notes (Signed)
Pt cleaned of BM, peri care provided. New chux and brief. Call bell provided. Tv remote provide. Stretcher locked in lowest position

## 2021-05-09 NOTE — ED Notes (Signed)
Pt calling out a saying random things.  Currently, sts he is nauseous.  Pt provided Diet Ginger Ale, a glass of ice, and paper towels per request.  Will continue to monitor.

## 2021-05-09 NOTE — ED Notes (Signed)
Pt asleep.

## 2021-05-09 NOTE — TOC Progression Note (Signed)
Transition of Care [facility]) - Progression Note    Patient Details  Name: Peter Nunez MRN: [id number] Date of Birth: Aug 01, 1940  [facility]) CM/SW Contact  Shelbie Hutching, RN Phone Number: 05/09/2021, 1:33 PM  Clinical Narrative:    Followed up with daughter via phone today.  Received work from [employer] with [employer] that they have found in home services that can start tomorrow 12/22- when [facility] spoke with daughter she said that she was working on getting the money together, that they were requiring upfront payment of $5500 and she was waiting to hear back from the bank as the money will need to come from her father's IRA.   RNCM will follow back up with [facility] this afternoon.  Patient needs to be picked up today if services are set up for tomorrow.     Expected Discharge Plan: Memory Care Barriers to Discharge: Other (must enter comment) (family will not pick up and COVID +)  Expected Discharge Plan and Services Expected Discharge Plan: Memory Care   Discharge Planning Services: CM Consult   Living arrangements for the past 2 months: Single Family Home                 DME Arranged: N/A DME Agency: NA       HH Arranged: NA HH Agency: NA         Social Determinants of Health (SDOH) Interventions    Readmission Risk Interventions No flowsheet data found.

## 2021-05-09 NOTE — ED Provider Notes (Signed)
Vitals:   05/09/21 1000 05/09/21 1100  BP: 119/60 123/69  Pulse:  67  Resp:  16  Temp:  97.6 F (36.4 C)  SpO2:  96%    Patient resting comfortably at this time.  Vital signs have remained stable.  He is pending placement by Education officer, museum.   Rada Hay, MD 05/09/21 [phone]

## 2021-05-10 MED ORDER — ACETAMINOPHEN 500 MG PO TABS
1000.0000 mg | ORAL_TABLET | Freq: Once | ORAL | Status: AC
  Administered 2021-05-10: 14:00:00 1000 mg via ORAL
  Filled 2021-05-10: qty 2

## 2021-05-10 NOTE — ED Provider Notes (Signed)
Today's Vitals   05/09/21 1439 05/09/21 1530 05/09/21 1658 05/10/21 0611  BP:    (!) 153/78  Pulse:    88  Resp:    18  Temp:    97.7 F (36.5 C)  TempSrc:    Oral  SpO2:    98%  Weight:      PainSc: 0-No pain 0-No pain 0-No pain 0-No pain   Body mass index is 28.48 kg/m.   Patient resting comfortably.  No acute complaints.  Awaiting social work disposition.   Makenli Derstine, Delice Bison, DO 05/10/21 [phone]

## 2021-05-10 NOTE — Evaluation (Signed)
Physical Therapy Evaluation Patient Details Name: Peter Nunez MRN: [id number] DOB: [birth year] Today's Date: 05/10/2021  History of Present Illness  Pt is a 80 y.o. male, seen on rounds today.  Pt initially presented to the ED for complaints of Psychiatric Evaluation  Currently, the patient is resting calmly without complaints.   Clinical Impression  Pt easily wakes, answers to his first name today, disoriented otherwise, thinks he is in [place]. Pt is very directive of care. Did agree to trying to mobilize today. Supine to sit with maxA, pt returned to supine twice spontaneously, at least minA to maintain sitting. Sit <> stand twice with RW, at least modA, and pt reliant on pulling on RW to stand. Quickly returns to sitting despite education and encouragement, pt refusing any further mobility. Session limited by patient cognition, anticipate that the patient could stand and potentially ambulate if motivated, but limited by cognition. At this time recommendation is higher levels of care that can provided physical and cognitive assist frequent/constant supervision. To remain on PT caseload for 1-2 more follow up visits to encourage mobilization during hospitalization as able.        Recommendations for follow up therapy are one component of a multi-disciplinary discharge planning process, led by the attending physician.  Recommendations may be updated based on patient status, additional functional criteria and insurance authorization.  Follow Up Recommendations Long-term institutional care without follow-up therapy    Assistance Recommended at Discharge Frequent or constant Supervision/Assistance  Functional Status Assessment  (PLOF unclear, per chart pt was at home, brought in by family)  Equipment Recommendations  Other (comment) (TBD)    Recommendations for Other Services       Precautions / Restrictions Precautions Precautions: Fall Restrictions Weight Bearing Restrictions: No       Mobility  Bed Mobility Overal bed mobility: Needs Assistance Bed Mobility: Supine to Sit;Sit to Supine     Supine to sit: Max assist;HOB elevated Sit to supine: Mod assist        Transfers Overall transfer level: Needs assistance Equipment used: Rolling walker (2 wheels) Transfers: Sit to/from Stand Sit to Stand: Mod assist           General transfer comment: pt reliant on pulling on RW to come up into standing    Ambulation/Gait               General Gait Details: unable, pt returns to sitting spontaneously  Stairs            Wheelchair Mobility    Modified Rankin (Stroke Patients Only)       Balance Overall balance assessment: Needs assistance Sitting-balance support: Feet supported;Bilateral upper extremity supported Sitting balance-Leahy Scale: Poor Sitting balance - Comments: minA to maintain sitting position, returns to supine twice, no safety awareness noted   Standing balance support: Reliant on assistive device for balance Standing balance-Leahy Scale: Poor                               Pertinent Vitals/Pain Pain Assessment: Faces Faces Pain Scale: No hurt Pain Intervention(s): Monitored during session;Repositioned    Home Living                     Additional Comments: pt stated he lives with his parents in [place]    Prior Function Prior Level of Function : Patient poor historian/Family not available  Hand Dominance        Extremity/Trunk Assessment   Upper Extremity Assessment Upper Extremity Assessment: Generalized weakness    Lower Extremity Assessment Lower Extremity Assessment: Difficult to assess due to impaired cognition;Generalized weakness    Cervical / Trunk Assessment Cervical / Trunk Assessment: Kyphotic  Communication      Cognition Arousal/Alertness: Awake/alert Behavior During Therapy: Flat affect Overall Cognitive Status: No family/caregiver present  to determine baseline cognitive functioning                                 General Comments: pt disoriented x3, answers to his name. Thinks he is in [place].        General Comments      Exercises     Assessment/Plan    PT Assessment Patient needs continued PT services  PT Problem List Decreased mobility;Decreased activity tolerance;Decreased balance;Decreased strength       PT Treatment Interventions DME instruction;Therapeutic exercise;Gait training;Balance training;Stair training;Patient/family education;Therapeutic activities;Neuromuscular re-education;Wheelchair mobility training    PT Goals (Current goals can be found in the Care Plan section)  Acute Rehab PT Goals PT Goal Formulation: Patient unable to participate in goal setting Time For Goal Achievement: 05/24/21 Potential to Achieve Goals: Poor    Frequency Min 2X/week   Barriers to discharge        Co-evaluation               AM-PAC PT "6 Clicks" Mobility  Outcome Measure Help needed turning from your back to your side while in a flat bed without using bedrails?: A Lot Help needed moving from lying on your back to sitting on the side of a flat bed without using bedrails?: A Lot Help needed moving to and from a bed to a chair (including a wheelchair)?: Total Help needed standing up from a chair using your arms (e.g., wheelchair or bedside chair)?: Total Help needed to walk in hospital room?: Total Help needed climbing 3-5 steps with a railing? : Total 6 Click Score: 8    End of Session Equipment Utilized During Treatment: Gait belt Activity Tolerance: Other (comment) (limited by cognition) Patient left: in bed;with call bell/phone within reach Nurse Communication: Mobility status PT Visit Diagnosis: Difficulty in walking, not elsewhere classified (R26.2);Muscle weakness (generalized) (M62.81)    Time: 0814-4818 PT Time Calculation (min) (ACUTE ONLY): 16 min   Charges:   PT  Evaluation $PT Eval Moderate Complexity: 1 Mod PT Treatments $Therapeutic Activity: 8-22 mins       Lieutenant Diego PT, DPT 11:19 AM,05/10/21

## 2021-05-10 NOTE — ED Notes (Signed)
Gave food tray with water.

## 2021-05-10 NOTE — ED Notes (Signed)
Pt sat up in bed- this writer cut food for pt and opened containers. Pt able to eat by self. No other needs voiced at this time.

## 2021-05-10 NOTE — ED Notes (Addendum)
Pt given breakfast tray, pt states he is not hungry at this time. Breakfast tray placed at bedside in case pt gets hungry later Claiborne Billings, RN notified.

## 2021-05-10 NOTE — ED Notes (Signed)
PT at bedside.

## 2021-05-10 NOTE — ED Notes (Signed)
Patient had soft brown stool. Cleaned with moist wipes and barrier cream appplied. Skin warm and dry. States when asked left hand hurts states Tylenol has help.

## 2021-05-10 NOTE — ED Notes (Signed)
Pt soiled bed with large BM.  Pt cleaned and peri care done.  Chux changed. New gown given

## 2021-05-10 NOTE — ED Notes (Signed)
Pt repositioned on his left side and barrier cream applied to the buttocks.

## 2021-05-10 NOTE — ED Notes (Signed)
VOL/pending placement 

## 2021-05-10 NOTE — ED Notes (Signed)
Tylenol given for Left hand and finger pain.

## 2021-05-10 NOTE — ED Notes (Signed)
Pt assisted with bedpan for BM  Peri care performed. Barrier cream applied.

## 2021-05-10 NOTE — ED Notes (Signed)
Pt resting comfrotably at this time. Pt dinner thrown away- pt requested to turn the lights out and go to sleep.

## 2021-05-11 MED ORDER — ACETAMINOPHEN 500 MG PO TABS
1000.0000 mg | ORAL_TABLET | Freq: Four times a day (QID) | ORAL | Status: DC | PRN
  Administered 2021-05-11 (×2): 1000 mg via ORAL
  Administered 2021-05-11: 17:00:00 500 mg via ORAL
  Administered 2021-05-12 – 2021-05-13 (×3): 1000 mg via ORAL
  Filled 2021-05-11 (×6): qty 2

## 2021-05-11 NOTE — ED Notes (Signed)
Pt complaining of left arm and hand pain, states that tylenol usually helps. Given PRN tylenol dose

## 2021-05-11 NOTE — ED Notes (Signed)
Pt repositioned onto left hip with pillows, to relieve pressure on bottom

## 2021-05-11 NOTE — ED Notes (Signed)
EDP notified of pulse readings.  No new orders placed at this time.

## 2021-05-11 NOTE — ED Notes (Signed)
Pt place don bedpan at this time and RN left room for privacy.

## 2021-05-11 NOTE — TOC Progression Note (Signed)
CSW reached out to pts daughter for update. CSW left VM requesting return call. TOC to follow.

## 2021-05-11 NOTE — ED Notes (Signed)
VOl pending placement 

## 2021-05-11 NOTE — ED Notes (Signed)
This RN put on new primifit external catheter and changed pt chux pad d/t primifit leakage.

## 2021-05-11 NOTE — ED Notes (Signed)
This RN helped pt off the bedpan at this time.  No BM noted.  Primifit still in place.

## 2021-05-11 NOTE — TOC Progression Note (Signed)
CSW spoke with pts daughter who states that she has a meeting Monday with a company that may be able to provide in home caregiver services. CSW requested that PT and RN staff continue to work with pt as he will need to be able to ambulate in the home as he was prior to arrival in the ED. TOC to follow.

## 2021-05-11 NOTE — Progress Notes (Signed)
Physical Therapy Treatment Patient Details Name: Peter Nunez MRN: [id number] DOB: 1941-02-06 Today's Date: 05/11/2021   History of Present Illness Pt is a 80 y.o. male, seen on rounds today.  Pt initially presented to the ED for complaints of Psychiatric Evaluation  Currently, the patient is resting calmly without complaints.    PT Comments    Pt was long sitting in bed watching TV upon arriving. He is alert however disoriented x2. He was however cooperative and pleasant this session. He presents with overall poor safety awareness and is at an extremely high fall risk due to cognition. He was able to exit L side of bed, stand to RW, and ambulate to doorway of room and return. Extremely poor gait safety. Pt was unable to keep grasp of RW during gait training due to LUE (wrist) pain. Author reached out to pt's daughter post session to discuss POC going forward. Pt will need 24/7 care at DC due to dementia and safety concerns. Author recommends long term care at a memory care unit or 24/7 assistance at home. Per family, they are unable to provide 24/7 care at DC and would like to look for placement for pt. Acute PT will continue to follow and progress as able per current POC.    Recommendations for follow up therapy are one component of a multi-disciplinary discharge planning process, led by the attending physician.  Recommendations may be updated based on patient status, additional functional criteria and insurance authorization.  Follow Up Recommendations  Long-term institutional care without follow-up therapy (Memory Unit)     Assistance Recommended at Discharge Frequent or constant Supervision/Assistance  Equipment Recommendations  Other (comment) (RW)       Precautions / Restrictions Precautions Precautions: Fall Restrictions Weight Bearing Restrictions: No     Mobility  Bed Mobility Overal bed mobility: Needs Assistance Bed Mobility: Supine to Sit;Sit to Supine     Supine to  sit: Min assist;Mod assist Sit to supine: Mod assist        Transfers Overall transfer level: Needs assistance Equipment used: Rolling walker (2 wheels) Transfers: Sit to/from Stand Sit to Stand: Min assist;Mod assist      Ambulation/Gait Ambulation/Gait assistance: Min assist;Mod assist Gait Distance (Feet): 25 Feet Assistive device: Rolling walker (2 wheels) (Pt barely hold RW with LUE due to pain) Gait Pattern/deviations: Step-to pattern;Staggering left;Staggering right;Drifts right/left;Shuffle Gait velocity: decreased     General Gait Details: Pt was able to ambulate to doorway of room and back to EOB. Extremely high fall risk. posterior LOB and staggering throughout      Balance Overall balance assessment: Needs assistance Sitting-balance support: Feet supported;Bilateral upper extremity supported Sitting balance-Leahy Scale: Fair Sitting balance - Comments: pt sat EOB x ~ 3 minutes with clsoe supervision required         Cognition Arousal/Alertness: Awake/alert Behavior During Therapy:  (Conversational however after fact checking with daughter/ is unreliable) Overall Cognitive Status: History of cognitive impairments - at baseline        General Comments: Pt is A and orineted x 2. Poor safety awareness and poor insight to overall situation               Pertinent Vitals/Pain Pain Assessment: 0-10 Pain Score: 6  Faces Pain Scale: Hurts little more Pain Location: L wrist/hand Pain Descriptors / Indicators: Discomfort;Guarding;Constant;Aching Pain Intervention(s): Monitored during session;Limited activity within patient's tolerance;Repositioned     PT Goals (current goals can now be found in the care plan section) Acute Rehab  PT Goals Patient Stated Goal: get home Progress towards PT goals: Progressing toward goals    Frequency    Min 2X/week      PT Plan Current plan remains appropriate       AM-PAC PT "6 Clicks" Mobility   Outcome  Measure  Help needed turning from your back to your side while in a flat bed without using bedrails?: A Lot Help needed moving from lying on your back to sitting on the side of a flat bed without using bedrails?: A Lot Help needed moving to and from a bed to a chair (including a wheelchair)?: A Lot Help needed standing up from a chair using your arms (e.g., wheelchair or bedside chair)?: A Lot Help needed to walk in hospital room?: A Lot Help needed climbing 3-5 steps with a railing? : Total 6 Click Score: 11    End of Session Equipment Utilized During Treatment: Gait belt Activity Tolerance: Patient tolerated treatment well;Other (comment) (cognition and poor safety awareness) Patient left: in bed;with call bell/phone within reach Nurse Communication: Mobility status PT Visit Diagnosis: Difficulty in walking, not elsewhere classified (R26.2);Muscle weakness (generalized) (M62.81)     Time: 7793-9030 PT Time Calculation (min) (ACUTE ONLY): 28 min  Charges:  $Gait Training: 8-22 mins $Therapeutic Activity: 8-22 mins                    Julaine Fusi PTA 05/11/21, 4:59 PM

## 2021-05-11 NOTE — ED Notes (Signed)
Pt assisted onto bedpan to have BM.

## 2021-05-11 NOTE — ED Notes (Signed)
Pt placed on bedpan at this time.

## 2021-05-11 NOTE — ED Notes (Signed)
Pt calling out- family at bedside. Pt confused- thinks he has not ate lunch, this writer showed pt his tray and explained to him that it seemed like he had ate. Pt agreeable. Family requesting to speak to nurse about pts performance with physical therapy, Jacque, RN notified and will be in when available.

## 2021-05-11 NOTE — ED Notes (Signed)
Pt resting comfortably in bed. Pt repositioned and provided juice And new blankets Pt is calm and cooperative with staff.

## 2021-05-11 NOTE — ED Notes (Signed)
This RN brought pt warm blankets and turned light down per pt request.  Pt resting in bed, alert at this time.

## 2021-05-12 NOTE — ED Notes (Signed)
Pt used urinal- 259ml discarded.
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
## 2021-05-12 NOTE — ED Notes (Signed)
Pt given applesauce, graham crackers, and diet gingerale per his request.

## 2021-05-12 NOTE — ED Notes (Signed)
This RN provided pt with warm compress for left hand pain.

## 2021-05-12 NOTE — ED Notes (Signed)
Per EDP, can try oxycodone if warm compress not effective.

## 2021-05-12 NOTE — ED Notes (Signed)
Pt placed on bedpan-cleansed when finished by this Probation officer and Deneise Lever, Therapist, sports. New chux placed on bed, new brief and barrier cream applied on pts bottom. No other needs voiced at this time.

## 2021-05-12 NOTE — ED Notes (Signed)
Patient given breakfast tray at this time.

## 2021-05-12 NOTE — ED Notes (Signed)
VOL/Pending CSW Placement

## 2021-05-12 NOTE — ED Notes (Signed)
Patient eating lunch tray.

## 2021-05-12 NOTE — ED Provider Notes (Signed)
Today's Vitals   05/12/21 0051 05/12/21 0218 05/12/21 0259 05/12/21 0640  BP:      Pulse:      Resp:      Temp:      TempSrc:      SpO2:      Weight:      PainSc: 0-No pain 0-No pain 10-Worst pain ever 0-No pain   Body mass index is 28.48 kg/m.   Patient resting comfortably.  No acute complaints.  Awaiting social work disposition.   Islam Eichinger, Delice Bison, DO 05/12/21 [phone]

## 2021-05-12 NOTE — ED Notes (Signed)
Patient given phone to call daughter.

## 2021-05-12 NOTE — ED Notes (Signed)
Pts dinner tray at bedside, pt currently sleeping.

## 2021-05-12 NOTE — ED Notes (Signed)
Pt given dinner tray; pt awake at this time and eating.

## 2021-05-13 DIAGNOSIS — F03918 Unspecified dementia, unspecified severity, with other behavioral disturbance: Secondary | ICD-10-CM

## 2021-05-13 LAB — CBG MONITORING, ED
Glucose-Capillary: 159 mg/dL — ABNORMAL HIGH (ref 70–99)
Glucose-Capillary: 180 mg/dL — ABNORMAL HIGH (ref 70–99)

## 2021-05-13 MED ORDER — LISINOPRIL 10 MG PO TABS
20.0000 mg | ORAL_TABLET | Freq: Every day | ORAL | Status: DC
  Administered 2021-05-14 – 2021-05-16 (×3): 20 mg via ORAL
  Filled 2021-05-13 (×4): qty 2

## 2021-05-13 MED ORDER — PREDNISONE 20 MG PO TABS: 40.0000 mg | ORAL_TABLET | Freq: Every day | ORAL | Status: DC

## 2021-05-13 MED ORDER — PREDNISONE 20 MG PO TABS: 20.0000 mg | ORAL_TABLET | Freq: Every day | ORAL | Status: DC

## 2021-05-13 MED ORDER — PREDNISONE 20 MG PO TABS: 30.0000 mg | ORAL_TABLET | Freq: Every day | ORAL | Status: DC

## 2021-05-13 MED ORDER — PREDNISONE 20 MG PO TABS
50.0000 mg | ORAL_TABLET | Freq: Every day | ORAL | Status: AC
  Administered 2021-05-13 – 2021-05-17 (×5): 50 mg via ORAL
  Filled 2021-05-13 (×5): qty 1

## 2021-05-13 MED ORDER — PREDNISONE 10 MG PO TABS: 10.0000 mg | ORAL_TABLET | Freq: Every day | ORAL | Status: DC

## 2021-05-13 NOTE — ED Notes (Signed)
Pt given lunch

## 2021-05-13 NOTE — ED Notes (Signed)
VOl pending placement 

## 2021-05-13 NOTE — ED Notes (Signed)
Pt with swollen and red right middle toe, pt denies pain. Pt with abrasion to left middle toe, skin intact. EDP notified, came to see pt.  Pt with stage one skin breakdown on buttocks, skin cleaned, moisture barrier and decub sacral dressing applied. Pt turned to side.  Pt with dry skin on legs and feet. Lotion applied. No other skin issues noted.  Pt bathed and bedding changed. Pt brushed teeth by himself.

## 2021-05-13 NOTE — ED Notes (Signed)
Pt given breakfast at this time.

## 2021-05-13 NOTE — ED Notes (Signed)
VOL/pending placement 

## 2021-05-13 NOTE — ED Notes (Signed)
Pt given snack at this time  

## 2021-05-13 NOTE — ED Notes (Signed)
This EDT and Gwenette Greet RN provided hygiene care and oral care at this time. All linen was changed.

## 2021-05-13 NOTE — ED Notes (Signed)
Pt given meal tray.

## 2021-05-13 NOTE — ED Notes (Signed)
This RN updated pt's daughter Milderd Meager via phone.

## 2021-05-13 NOTE — ED Notes (Signed)
Warm blankets and gingerale provided.  No other needs at this time

## 2021-05-13 NOTE — Consult Note (Addendum)
Peter Nunez Psychiatry Consult   Reason for Consult:Suicidal ideations Referring Physician: Dr. 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  Just need to get a hold of my grandson and have him check on my dog."  80 yo male with dementia, placed a tube around his neck yesterday and stated, "sometimes life is no worth living."  Denied to the RN he was trying to hurt himself.  Today, he states states, "I was just messing with them".  Denies depression, suicidal/homicidal ideations, hallucinations, and substance abuse.  Concerned about his dog, psychiatrically stable.  12/16 consult: When asked what brought him to the ED, he stated, "I was mean.  I'm tired of my daughter trying to run my life."  80 yo male with a history of dementia.  He got upset with his daughter last night and was "mean".  Today, he is calm and cooperative with positive for COVID.  He denies suicidal/homicidal ideations, hallucinations, and substance use.  [name] does have insight into his behaviors and frustrations.  His daughter is concerned about recent mood changes and accusations from him that she is mistreating him. Considering his diagnosis of dementia, his behaviors and mood will continue to decompensate unfortunately; memory care may need to be considered.  Medications adjusted to assist with his mood and frustration.  Psychiatrically cleared for discharge.  Caveat:  His daughter reports she told the ED she was going out of town and not returning until Sunday.  "There's no one to come get him."  Per Caroline Sauger on  admission: Peter Nunez is a 80 y.o. male patient presented to [facility] ED via law enforcement under involuntary commitment status (IVC). The patient denies attacking his daughter due to his dementia diagnosis. The patient states, "I had children with the wrong woman."  He voiced that the kids want to tell him what to do. He shared, "I do not have a problem with him telling me what to do, but it is how to go about it."  He shared his kids are telling him that he does not want anything and they will sell his house from underneath him. He states, "I do not have an anger issue. I want to be treated with respect."  The patient was seen face-to-face by this provider; the chart was reviewed and consulted with Dr.Smith on 05/03/2021 due to the patient's care. It was discussed with the EDP that the patient does meet the criteria to be admitted to the geriatric psychiatric inpatient unit.  On evaluation, the patient is alert and oriented x 3, calm, cooperative, and mood-congruent with affect.  The patient does not appear to be responding to internal or external stimuli. Neither is the patient presenting with any delusional thinking. The patient denies auditory or visual hallucinations. The patient denies any suicidal, homicidal, or self-harm ideations. The patient is not presenting with any psychotic or paranoid behaviors.  HPI: Per Dr. Tamala Julian, Peter Nunez is a 80 y.o. malewho presents to the ED for evaluation of his mental health and aggression. Chart review indicates history of HTN, HLD, DM and stroke.  He is on Eliquis for atrial fibrillation.  History of mixed Lewy body and vascular dementia, seeing [facility] neuro, prescribed Lamictal and Seroquel. Patient presents to the ED for evaluation of increasing aggressive behavior at home.  He reports feeling fine and his only complaint to me is that he "had children with the wrong woman."  Denies recent illnesses and denies the allegations on his IVC paperwork that he has been  aggressive and tearing up his house, threatening his daughter.  History somewhat limited due to his dementia and refusal to participate.  Denies any current pain or recent illnesses. As indicated below, within the ED course, majority of history is provided by the daughter over the phone.  She reports increasing aggressive behavior with the patient at home over the past couple weeks.  His mood swings rapidly and he is often aggressive, breaking things in the house and accusing her of mistreating him when she has been tried to go out of her way to make accommodations for him and get care in the house.   Past Psychiatric History:  Stroke [facility])  Risk to Self:   Risk to Others:   Prior Inpatient Therapy:   Prior Outpatient Therapy:    Past Medical History:  Past Medical History:  Diagnosis Date   Diabetes mellitus ([place])    HTN (hypertension)    Hyperlipidemia    Stroke [facility])     Past Surgical History:  Procedure Laterality Date   DECORTICATION  10/23/2011   Procedure: DECORTICATION;  Surgeon: Gaye Pollack, MD;  Location: [place];  Service: Thoracic;  Laterality: Right;   FLEXIBLE BRONCHOSCOPY  10/23/2011   Procedure: FLEXIBLE BRONCHOSCOPY;  Surgeon: Gaye Pollack, MD;  Location: [place];  Service: Thoracic;  Laterality: N/A;   PLEURAL EFFUSION DRAINAGE  10/23/2011   Procedure: DRAINAGE OF PLEURAL EFFUSION;  Surgeon: Gaye Pollack, MD;  Location: [place];  Service: Thoracic;  Laterality: Right;   THORACOTOMY  10/23/2011   Procedure: THORACOTOMY MAJOR;  Surgeon: Gaye Pollack, MD;  Location: [place];  Service: Thoracic;  Laterality: Right;   Family History: No family history on file. Family Psychiatric  History:  Social History:  Social History   Substance and Sexual Activity  Alcohol Use Yes   Comment: occasional     Social History   Substance and Sexual Activity  Drug Use Never    Social History   Socioeconomic History   Marital status: Widowed    Spouse name: Not on file   Number of  children: Not on file   Years of education: Not on file   Highest education level: Not on file  Occupational History   Occupation: retired - Equities trader    Occupation: retired Hotel manager   Tobacco Use   Smoking status: Former    Packs/day: 1.00    Years: 20.00    Pack years: 20.00    Types: Cigarettes    Quit date: 10/22/1980    Years since quitting: 40.5   Smokeless tobacco: Not on file  Substance and Sexual Activity   Alcohol use: Yes    Comment: occasional   Drug use: Never   Sexual activity: Not on file  Other Topics Concern   Not on file  Social History Narrative   Not on file   Social Determinants of Health   Financial Resource Strain: Not on file  Food Insecurity: Not on file  Transportation Needs: Not on file  Physical Activity: Not on file  Stress: Not on file  Social Connections: Not on file  Additional Social History:    Allergies:   Allergies  Allergen Reactions   Donepezil Diarrhea    Severe diarrhea   Citalopram Other (See Comments)    Worsened bradykinesia   Venlafaxine Other (See Comments)    Worsened bradykinesia   Bee Venom     unknown   Methylprednisolone Hives   Percocet [Oxycodone-Acetaminophen] Other (See Comments)    hallucinations   Zolpidem Tartrate     Labs:  No results found for this or any previous visit (from the past 48 hour(s)).   Current Facility-Administered Medications  Medication Dose Route Frequency Provider Last Rate Last Admin   acetaminophen (TYLENOL) tablet 1,000 mg  1,000 mg Oral Q6H PRN Blake Divine, MD   1,000 mg at 05/12/21 2124   apixaban (ELIQUIS) tablet 5 mg  5 mg Oral BID Duffy Bruce, MD   5 mg at 05/12/21 2130   atorvastatin (LIPITOR) tablet 40 mg  40 mg Oral q morning Duffy Bruce, MD   40 mg at 05/12/21 2951   diltiazem (CARDIZEM CD) 24 hr capsule 240 mg  240 mg Oral Daily Duffy Bruce, MD   240 mg at 05/12/21 0905   glipiZIDE (GLUCOTROL) tablet 5 mg  5 mg Oral BID WC Duffy Bruce, MD   5 mg  at 05/12/21 0734   hydrochlorothiazide (HYDRODIURIL) tablet 25 mg  25 mg Oral Daily Duffy Bruce, MD   25 mg at 05/12/21 0905   lamoTRIgine (LAMICTAL) tablet 75 mg  75 mg Oral BID Duffy Bruce, MD   75 mg at 05/12/21 2125   latanoprost (XALATAN) 0.005 % ophthalmic solution 1 drop  1 drop Both Eyes QHS Duffy Bruce, MD   1 drop at 05/12/21 2127   lisinopril (ZESTRIL) tablet 40 mg  40 mg Oral Daily Duffy Bruce, MD   40 mg at 05/12/21 8841   metFORMIN (GLUCOPHAGE) tablet 500 mg  500 mg Oral BID WC Duffy Bruce, MD   500 mg at 05/12/21 0734   metoprolol succinate (TOPROL-XL) 24 hr tablet 25 mg  25 mg Oral Daily Duffy Bruce, MD   25 mg at 05/11/21 0905   pantoprazole (PROTONIX) EC tablet 20 mg  20 mg Oral Daily Duffy Bruce, MD   20 mg at 05/12/21 6606   risperiDONE (RISPERDAL) tablet 0.5 mg  0.5 mg Oral BID Patrecia Pour, NP   0.5 mg at 05/12/21 2124   spironolactone (ALDACTONE) tablet 25 mg  25 mg Oral Daily Duffy Bruce, MD   25 mg at 05/12/21 3016   tamsulosin (FLOMAX) capsule 0.4 mg  0.4 mg Oral q morning Duffy Bruce, MD   0.4 mg at 05/12/21 0109   traMADol (ULTRAM) tablet 50 mg  50 mg Oral Q6H PRN Duffy Bruce, MD   50 mg at 05/09/21 1439   Current Outpatient Medications  Medication Sig Dispense Refill   apixaban (ELIQUIS) 5 MG TABS tablet Take 5 mg by mouth 2 (two) times daily.     atorvastatin (LIPITOR) 40 MG tablet Take 40 mg by mouth every morning.     diltiazem (CARDIZEM CD) 240 MG 24 hr capsule Take 240 mg by mouth daily.     glipiZIDE (GLUCOTROL) 5 MG tablet Take 5 mg by mouth 2 (two) times daily.     hydrochlorothiazide (HYDRODIURIL) 25 MG tablet Take 25 mg by mouth daily.     lamoTRIgine (LAMICTAL) 25 MG tablet Take 75 mg by mouth 2 (two) times daily.     latanoprost (XALATAN) 0.005 % ophthalmic solution  1 drop at bedtime.     lisinopril (ZESTRIL) 40 MG tablet Take 40 mg by mouth daily.     metFORMIN (GLUCOPHAGE) 500 MG tablet Take 500 mg by mouth 2  (two) times daily with a meal.     metoprolol succinate (TOPROL-XL) 25 MG 24 hr tablet Take 25 mg by mouth daily.     niacin (NIASPAN) 500 MG CR tablet Take 500 mg by mouth at bedtime.     Omega-3 1000 MG CAPS Take 2 capsules by mouth daily.     pantoprazole (PROTONIX) 20 MG tablet Take 20 mg by mouth daily.     QUEtiapine (SEROQUEL) 25 MG tablet Take 25 mg by mouth 2 (two) times daily.     spironolactone (ALDACTONE) 25 MG tablet Take 25 mg by mouth daily.     tamsulosin (FLOMAX) 0.4 MG CAPS capsule Take 0.4 mg by mouth every morning.     aspirin 81 MG EC tablet aspirin 81 mg tablet,delayed release  Take 1 tablet every day by oral route. (Patient not taking: Reported on 05/03/2021)     clindamycin (CLEOCIN) 300 MG capsule Take 1 capsule (300 mg total) by mouth 3 (three) times daily. (Patient not taking: Reported on 05/03/2021) 30 capsule 0   donepezil (ARICEPT) 5 MG tablet Take by mouth. (Patient not taking: Reported on 05/03/2021)     traMADol (ULTRAM) 50 MG tablet Take 50 mg by mouth every 6 (six) hours as needed.      Musculoskeletal: Strength & Muscle Tone: within normal limits Gait & Station: normal Patient leans: N/A  Psychiatric Specialty Exam: Physical Exam Vitals and nursing note reviewed.  Constitutional:      Appearance: Normal appearance.  HENT:     Head: Normocephalic and atraumatic.     Right Ear: External ear normal.     Left Ear: External ear normal.     Nose: Nose normal.  Cardiovascular:     Rate and Rhythm: Bradycardia present.  Pulmonary:     Effort: Pulmonary effort is normal.  Musculoskeletal:     Cervical back: Normal range of motion and neck supple.  Neurological:     General: No focal deficit present.     Mental Status: He is alert.  Psychiatric:        Attention and Perception: Attention and perception normal.        Mood and Affect: Affect normal. Mood is anxious.        Speech: Speech normal.        Behavior: Behavior normal.        Thought  Content: Thought content normal.        Cognition and Memory: Memory is impaired.        Judgment: Judgment is impulsive.    Review of Systems  Neurological:  Positive for weakness.  Psychiatric/Behavioral:  Positive for memory loss. The patient is nervous/anxious.   All other systems reviewed and are negative.  Blood pressure (!) 80/66, pulse 68, temperature 98.2 F (36.8 C), resp. rate 16, weight 95.3 kg, SpO2 95 %.Body mass index is 28.48 kg/m.  General Appearance: Casual  Eye Contact:  Good  Speech:  Normal Rate  Volume:  Normal  Mood:  Anxious  Affect:  Blunt  Thought Process:  Coherent and Descriptions of Associations: Intact  Orientation:  Full (Time, Place, and Person)  Thought Content:  WDL and Logical  Suicidal Thoughts:  No  Homicidal Thoughts:  No  Memory:  Immediate;   Fair  Judgement:  Fair  Insight:  Fair  Psychomotor Activity:  Normal  Concentration:  Concentration: Good and Attention Span: Good  Recall:  [place] of Knowledge:  Good  Language:  Good  Akathisia:  No  Handed:  Right  AIMS (if indicated):     Assets:  Housing Leisure Time Resilience Social Support  ADL's:  Intact  Cognition:  Impaired,  Mild  Sleep:      cla   Physical Exam: Physical Exam Vitals and nursing note reviewed.  Constitutional:      Appearance: Normal appearance.  HENT:     Head: Normocephalic and atraumatic.     Right Ear: External ear normal.     Left Ear: External ear normal.     Nose: Nose normal.  Cardiovascular:     Rate and Rhythm: Bradycardia present.  Pulmonary:     Effort: Pulmonary effort is normal.  Musculoskeletal:     Cervical back: Normal range of motion and neck supple.  Neurological:     General: No focal deficit present.     Mental Status: He is alert.  Psychiatric:        Attention and Perception: Attention and perception normal.        Mood and Affect: Affect normal. Mood is anxious.        Speech: Speech normal.        Behavior:  Behavior normal.        Thought Content: Thought content normal.        Cognition and Memory: Memory is impaired.        Judgment: Judgment is impulsive.   Review of Systems  Neurological:  Positive for weakness.  Psychiatric/Behavioral:  Positive for memory loss. The patient is nervous/anxious.   All other systems reviewed and are negative. Blood pressure (!) 80/66, pulse 68, temperature 98.2 F (36.8 C), resp. rate 16, weight 95.3 kg, SpO2 95 %. Body mass index is 28.48 kg/m.  Treatment Plan Summary: Dementia with behavioral changes: -Continue Risperdal 0.5 mg BID -Continue Lamictal 75 mg BID per his provider -follow up with outpatient provider  Disposition: Psychiatrically stable  Waylan Boga, NP 05/13/2021 7:10 AM

## 2021-05-13 NOTE — ED Notes (Signed)
Pt told this RN to " get along" and "get out" when this RN entered room. Pt on hospital bed, in brief, multiple blankets and pads on floor. Room cleaned.

## 2021-05-13 NOTE — ED Notes (Signed)
Message sent to pharmacy for missing meds 

## 2021-05-14 NOTE — ED Notes (Signed)
Pt resting comfortably at this time. Will hold oral glycemic medications until patient wakes up to eat dinner at this time.

## 2021-05-14 NOTE — ED Notes (Signed)
Pt daughter updated on patient condition

## 2021-05-14 NOTE — ED Notes (Signed)
Pt given breakfast tray, pt pulled into bed and assisted with opening up utensils and breakfast tray. VS obtained.

## 2021-05-14 NOTE — ED Notes (Signed)
Pt assisted with use of urinal 200 cc output

## 2021-05-14 NOTE — ED Notes (Signed)
VOL/Pending placement 

## 2021-05-14 NOTE — ED Provider Notes (Signed)
----------------------------------------- °  6:55 AM on 05/14/2021 -----------------------------------------   Blood pressure (!) 131/58, pulse 68, temperature 97.6 F (36.4 C), resp. rate 16, weight 95.3 kg, SpO2 99 %.  The patient is calm and cooperative at this time.  There have been no acute events since the last update.  Awaiting disposition plan from [facility] team.   Hinda Kehr, MD 05/14/21 [phone]

## 2021-05-14 NOTE — ED Notes (Signed)
Pt is awake and received a snack and drink

## 2021-05-14 NOTE — ED Notes (Signed)
Pt urinated on self. Pt able to roll self to let this RN and Mel, NT change patient and change bed sheets and provide peri care. Pt only oriented to self to self today. Pt confused and asking this RN if we are in [place].

## 2021-05-14 NOTE — ED Notes (Signed)
Pt is asleep at this time, will get VS and snack when awake

## 2021-05-14 NOTE — ED Notes (Signed)

## 2021-05-14 NOTE — ED Notes (Signed)
Pts lunch tray placed at bedside; pt currently sleeping.

## 2021-05-15 LAB — CBG MONITORING, ED: Glucose-Capillary: 395 mg/dL — ABNORMAL HIGH (ref 70–99)

## 2021-05-15 NOTE — TOC Progression Note (Signed)
Transition of Care [facility]) - Progression Note    Patient Details  Name: Peter Nunez MRN: [id number] Date of Birth: [birth year]  [facility]) CM/SW Contact  Anselm Pancoast, RN Phone Number: 05/15/2021, 12:08 PM  Clinical Narrative:    Damaris Schooner with Danielle @ [employer] who is planning to visit patient today with family to complete Care Plan for home services. Notified ED RN.    Expected Discharge Plan: Memory Care Barriers to Discharge: Other (must enter comment) (family will not pick up and COVID +)  Expected Discharge Plan and Services Expected Discharge Plan: Memory Care   Discharge Planning Services: CM Consult   Living arrangements for the past 2 months: Single Family Home                 DME Arranged: N/A DME Agency: NA       HH Arranged: NA HH Agency: NA         Social Determinants of Health (SDOH) Interventions    Readmission Risk Interventions No flowsheet data found.

## 2021-05-15 NOTE — Progress Notes (Signed)
Physical Therapy Treatment Patient Details Name: Peter Nunez MRN: [id number] DOB: 04-13-1941 Today's Date: 05/15/2021   History of Present Illness Pt is a 80 y.o. male that initially presented to the ED for complaints of Psychiatric Evaluation.    PT Comments    Pt alert, oriented to name and birthday only. Reported L wrist pain, some limitations in functional activities with wrist (weight bearing, RW management) noted. Supine to sit minA, able to sit with BUE propped and perform some seated therapeutic exercises. Sit <> Stand with RW and at least min-modA twice, reliant on RW to pull up on, posterior lean and R lateral lean noted. He was able to ambulate ~12ft with RW and minA, gait deficits noted and pt unsteady. Second bout of ~61ft of ambulation very similar, posterior lean throughout. Returned to supine with CGA, all needs in reach.      Recommendations for follow up therapy are one component of a multi-disciplinary discharge planning process, led by the attending physician.  Recommendations may be updated based on patient status, additional functional criteria and insurance authorization.  Follow Up Recommendations  Long-term institutional care without follow-up therapy (Memory Unit)     Assistance Recommended at Discharge Frequent or constant Supervision/Assistance  Equipment Recommendations  Other (comment) (RW)    Recommendations for Other Services       Precautions / Restrictions Precautions Precautions: Fall Restrictions Weight Bearing Restrictions: No     Mobility  Bed Mobility Overal bed mobility: Needs Assistance Bed Mobility: Supine to Sit;Sit to Supine     Supine to sit: HOB elevated;Min assist Sit to supine: Min guard;HOB elevated        Transfers Overall transfer level: Needs assistance Equipment used: Rolling walker (2 wheels) Transfers: Sit to/from Stand Sit to Stand: Min assist;Mod assist           General transfer comment: pt reliant on  pulling on RW to come up into standing, R lateral lean noted    Ambulation/Gait Ambulation/Gait assistance: Min assist Gait Distance (Feet):  (52ft, then 5 ft) Assistive device: Rolling walker (2 wheels)         General Gait Details: pt reported being told to walk like a penguin previously, unsteady throughout, assistance with RW needed   Stairs             Wheelchair Mobility    Modified Rankin (Stroke Patients Only)       Balance Overall balance assessment: Needs assistance Sitting-balance support: Feet supported;Bilateral upper extremity supported Sitting balance-Leahy Scale: Good Sitting balance - Comments: able to sit and perform some seated therex   Standing balance support: Reliant on assistive device for balance Standing balance-Leahy Scale: Poor                              Cognition Arousal/Alertness: Awake/alert Behavior During Therapy: WFL for tasks assessed/performed Overall Cognitive Status: History of cognitive impairments - at baseline                                 General Comments: pt oriented to self only.        Exercises Other Exercises Other Exercises: seated marching and LAQ x10 bilaterally verbal and visual cues    General Comments        Pertinent Vitals/Pain Pain Assessment: Faces Faces Pain Scale: Hurts a little bit Pain Location: L wrist/hand Pain Descriptors /  Indicators: Sore Pain Intervention(s): Limited activity within patient's tolerance;Monitored during session;Repositioned    Home Living                          Prior Function            PT Goals (current goals can now be found in the care plan section) Progress towards PT goals: Progressing toward goals    Frequency    Min 2X/week      PT Plan Current plan remains appropriate    Co-evaluation              AM-PAC PT "6 Clicks" Mobility   Outcome Measure  Help needed turning from your back to your side  while in a flat bed without using bedrails?: A Lot Help needed moving from lying on your back to sitting on the side of a flat bed without using bedrails?: A Lot Help needed moving to and from a bed to a chair (including a wheelchair)?: A Lot Help needed standing up from a chair using your arms (e.g., wheelchair or bedside chair)?: A Lot Help needed to walk in hospital room?: A Lot Help needed climbing 3-5 steps with a railing? : Total 6 Click Score: 11    End of Session Equipment Utilized During Treatment: Gait belt Activity Tolerance: Patient tolerated treatment well;Other (comment) (cognition and poor safety awareness) Patient left: in bed;with call bell/phone within reach Nurse Communication: Mobility status PT Visit Diagnosis: Difficulty in walking, not elsewhere classified (R26.2);Muscle weakness (generalized) (M62.81)     Time: 3335-4562 PT Time Calculation (min) (ACUTE ONLY): 20 min  Charges:  $Therapeutic Exercise: 8-22 mins                     Lieutenant Diego PT, DPT 10:18 AM,05/15/21

## 2021-05-15 NOTE — ED Notes (Signed)
This Probation officer set breakfast up for pt; pt awake at this time.

## 2021-05-15 NOTE — ED Notes (Signed)
PT at bedside.

## 2021-05-15 NOTE — ED Notes (Signed)
Pt assisted to bedside commode by this Marketing executive, Therapist, sports. Pt had bowel movement. Pt cleansed, new brief applied. Pt in bed lying and resting; no other needs voiced at this time.

## 2021-05-15 NOTE — ED Notes (Signed)
Gave food tray with drink.

## 2021-05-15 NOTE — ED Notes (Signed)
Patient hallucinating, seeing skunks in his bed. Also talking about skunks spraying around the room.

## 2021-05-15 NOTE — ED Notes (Signed)
VOLUNTARY continues to await TOC disposition

## 2021-05-15 NOTE — ED Notes (Signed)
Assisted patient with urinal.

## 2021-05-16 ENCOUNTER — Emergency Department: Payer: Medicare HMO

## 2021-05-16 LAB — HEMOGLOBIN A1C
Hgb A1c MFr Bld: 6.8 % — ABNORMAL HIGH (ref 4.8–5.6)
Mean Plasma Glucose: 148.46 mg/dL

## 2021-05-16 LAB — RESP PANEL BY RT-PCR (FLU A&B, COVID) ARPGX2
Influenza A by PCR: NEGATIVE
Influenza B by PCR: NEGATIVE
SARS Coronavirus 2 by RT PCR: POSITIVE — AB

## 2021-05-16 LAB — CBG MONITORING, ED
Glucose-Capillary: 249 mg/dL — ABNORMAL HIGH (ref 70–99)
Glucose-Capillary: 339 mg/dL — ABNORMAL HIGH (ref 70–99)

## 2021-05-16 MED ORDER — INSULIN ASPART 100 UNIT/ML IJ SOLN
0.0000 [IU] | Freq: Three times a day (TID) | INTRAMUSCULAR | Status: DC
  Administered 2021-05-16: 17:00:00 11 [IU] via SUBCUTANEOUS
  Administered 2021-05-17 (×2): 15 [IU] via SUBCUTANEOUS
  Filled 2021-05-16 (×3): qty 1

## 2021-05-16 NOTE — ED Notes (Signed)
Pt voided into urinal, 280mL

## 2021-05-16 NOTE — ED Notes (Signed)
Incontinent X 1 with urine overnight. Pericare performed. Clean and dry at this time

## 2021-05-16 NOTE — ED Notes (Signed)
Pt refused snack but asked for orange juice

## 2021-05-16 NOTE — ED Notes (Signed)
CBG: [place], RN notified.

## 2021-05-16 NOTE — ED Provider Notes (Signed)
----------------------------------------- °  8:11 AM on 05/16/2021 -----------------------------------------   Blood pressure (!) 157/79, pulse 90, temperature 98.4 F (36.9 C), temperature source Oral, resp. rate 16, weight 95.3 kg, SpO2 98 %.  The patient is calm and cooperative at this time.  There have been no acute events since the last update.  Awaiting disposition plan from [facility] team.   Hinda Kehr, MD 05/16/21 [phone]

## 2021-05-16 NOTE — ED Notes (Signed)
Pt assisted to bedside commode by Deneise Lever, Therapist, sports and this Probation officer. Pt changed into clean gown; bed linen changed and new chux on bed at this time.

## 2021-05-16 NOTE — TOC Progression Note (Signed)
Transition of Care [facility]) - Progression Note    Patient Details  Name: Peter Nunez MRN: [id number] Date of Birth: [birth year]  Transition of Care [facility]) CM/SW Contact  Shelbie Hutching, RN Phone Number: 05/16/2021, 4:53 PM  Clinical Narrative:    [facility] has accepted patient for placement.  They can accept patient tomorrow as long as all paperwork and payment gets finalized.  FL2 secure faxed to group home owner.  Patient is out of COVID quarantine, and chest  x ray ordered to r/o TB.     Expected Discharge Plan: Group Home (Family care home) Barriers to Discharge: Other (must enter comment) (family will not pick up and COVID +)  Expected Discharge Plan and Services Expected Discharge Plan: Group Home (Family care home)   Discharge Planning Services: CM Consult   Living arrangements for the past 2 months: Single Family Home                 DME Arranged: N/A DME Agency: NA       HH Arranged: NA HH Agency: NA         Social Determinants of Health (SDOH) Interventions    Readmission Risk Interventions No flowsheet data found.

## 2021-05-16 NOTE — ED Notes (Signed)
Pt given dinner tray.

## 2021-05-16 NOTE — NC FL2 (Addendum)
[place] LEVEL OF CARE SCREENING TOOL     IDENTIFICATION  Patient Name: Peter Nunez Birthdate: [birth year] Sex: male Admission Date (Current Location): 05/03/2021  [facility] and [place] Number:  Engineering geologist and Address:  [facility], [address] [place], [place] [address]      Provider Number: [id number]  Attending Physician Name and Address:  No att. 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[place]) 05/04/2021   Stroke ([place]) 05/04/2021   Vascular dementia ([place]) 05/04/2021   Dementia with behavioral disturbance 05/04/2021   Atrial fibrillation with RVR ([place]) 10/27/2011   HTN (hypertension) 10/23/2011   Diabetes mellitus ([place]) 10/23/2011   Acute respiratory failure ([place]) 10/23/2011   Empyema ([place]) 10/23/2011   Acute renal failure ([place]) 10/23/2011    Orientation RESPIRATION BLADDER Height & Weight     Self, Place, Situation  Normal Continent Weight: 95.3 kg Height:     BEHAVIORAL SYMPTOMS/MOOD NEUROLOGICAL BOWEL NUTRITION STATUS  Dangerous to self, others or property (has damaged property at home)   Continent Diet  AMBULATORY STATUS COMMUNICATION OF NEEDS Skin   Limited Assist Verbally Normal                       Personal Care Assistance Level of Assistance  Bathing, Feeding, Total care, Dressing Bathing Assistance: Independent Feeding assistance: Independent Dressing Assistance: Limited assistance Total Care Assistance: Limited assistance   Functional Limitations Info  Sight, Hearing, Speech Sight  Info: Adequate Hearing Info: Adequate Speech Info: Adequate    SPECIAL CARE FACTORS FREQUENCY  PT (By licensed PT)     PT Frequency: home health PT              Contractures Contractures Info: Not present    Additional Factors Info  Psychotropic               Current Medications (05/16/2021):  This is the current hospital active medication list Current Facility-Administered Medications  Medication Dose Route Frequency Provider Last Rate Last Admin   acetaminophen (TYLENOL) tablet 1,000 mg  1,000 mg Oral Q6H PRN Blake Divine, MD   1,000 mg at 05/13/21 1631   apixaban (ELIQUIS) tablet 5 mg  5 mg Oral BID Duffy Bruce, MD   5 mg at 05/16/21 1048   atorvastatin (LIPITOR) tablet 40 mg  40 mg Oral q morning Duffy Bruce, MD   40 mg at 05/16/21 1048   diltiazem (CARDIZEM CD) 24 hr capsule 240 mg  240 mg Oral Daily Duffy Bruce, MD   240 mg at 05/16/21 1047   glipiZIDE (GLUCOTROL) tablet 5 mg  5 mg Oral BID WC Duffy Bruce, MD   5 mg at 05/16/21 1048   insulin aspart (novoLOG) injection 0-15 Units  0-15 Units Subcutaneous TID WC Lucrezia Starch, MD       lamoTRIgine (LAMICTAL) tablet 75 mg  75 mg Oral BID Duffy Bruce, MD   75 mg at 05/16/21 1047   latanoprost (XALATAN) 0.005 % ophthalmic solution 1 drop  1 drop Both Eyes QHS Duffy Bruce, MD  1 drop at 05/15/21 2104   lisinopril (ZESTRIL) tablet 20 mg  20 mg Oral Daily Harvest Dark, MD   20 mg at 05/16/21 1048   metFORMIN (GLUCOPHAGE) tablet 500 mg  500 mg Oral BID WC Duffy Bruce, MD   500 mg at 05/16/21 1047   metoprolol succinate (TOPROL-XL) 24 hr tablet 25 mg  25 mg Oral Daily Duffy Bruce, MD   25 mg at 05/16/21 1048   pantoprazole (PROTONIX) EC tablet 20 mg  20 mg Oral Daily Duffy Bruce, MD   20 mg at 05/16/21 1048   predniSONE (DELTASONE) tablet 50 mg  50 mg Oral Daily Vladimir Crofts, MD   50 mg at 05/16/21 1047   Followed by   Derrill Memo ON 05/18/2021] predniSONE (DELTASONE) tablet 40 mg   40 mg Oral Daily Vladimir Crofts, MD       Followed by   Derrill Memo ON 05/20/2021] predniSONE (DELTASONE) tablet 30 mg  30 mg Oral Daily Vladimir Crofts, MD       Followed by   Derrill Memo ON 05/22/2021] predniSONE (DELTASONE) tablet 20 mg  20 mg Oral Daily Vladimir Crofts, MD       Followed by   Derrill Memo ON 05/24/2021] predniSONE (DELTASONE) tablet 10 mg  10 mg Oral Daily Vladimir Crofts, MD       risperiDONE (RISPERDAL) tablet 0.5 mg  0.5 mg Oral BID Patrecia Pour, NP   0.5 mg at 05/16/21 1048   spironolactone (ALDACTONE) tablet 25 mg  25 mg Oral Daily Duffy Bruce, MD   25 mg at 05/16/21 1048   tamsulosin (FLOMAX) capsule 0.4 mg  0.4 mg Oral q morning Duffy Bruce, MD   0.4 mg at 05/16/21 1048   traMADol (ULTRAM) tablet 50 mg  50 mg Oral Q6H PRN Duffy Bruce, MD   50 mg at 05/09/21 1439   Current Outpatient Medications  Medication Sig Dispense Refill   apixaban (ELIQUIS) 5 MG TABS tablet Take 5 mg by mouth 2 (two) times daily.     atorvastatin (LIPITOR) 40 MG tablet Take 40 mg by mouth every morning.     diltiazem (CARDIZEM CD) 240 MG 24 hr capsule Take 240 mg by mouth daily.     glipiZIDE (GLUCOTROL) 5 MG tablet Take 5 mg by mouth 2 (two) times daily.     hydrochlorothiazide (HYDRODIURIL) 25 MG tablet Take 25 mg by mouth daily.     lamoTRIgine (LAMICTAL) 25 MG tablet Take 75 mg by mouth 2 (two) times daily.     latanoprost (XALATAN) 0.005 % ophthalmic solution 1 drop at bedtime.     lisinopril (ZESTRIL) 40 MG tablet Take 40 mg by mouth daily.     metFORMIN (GLUCOPHAGE) 500 MG tablet Take 500 mg by mouth 2 (two) times daily with a meal.     metoprolol succinate (TOPROL-XL) 25 MG 24 hr tablet Take 25 mg by mouth daily.     niacin (NIASPAN) 500 MG CR tablet Take 500 mg by mouth at bedtime.     Omega-3 1000 MG CAPS Take 2 capsules by mouth daily.     pantoprazole (PROTONIX) 20 MG tablet Take 20 mg by mouth daily.     QUEtiapine (SEROQUEL) 25 MG tablet Take 25 mg by mouth 2 (two) times daily.      spironolactone (ALDACTONE) 25 MG tablet Take 25 mg by mouth daily.     tamsulosin (FLOMAX) 0.4 MG CAPS capsule Take 0.4 mg by mouth every morning.     aspirin 81 MG  EC tablet aspirin 81 mg tablet,delayed release  Take 1 tablet every day by oral route. (Patient not taking: Reported on 05/03/2021)     clindamycin (CLEOCIN) 300 MG capsule Take 1 capsule (300 mg total) by mouth 3 (three) times daily. (Patient not taking: Reported on 05/03/2021) 30 capsule 0   donepezil (ARICEPT) 5 MG tablet Take by mouth. (Patient not taking: Reported on 05/03/2021)     traMADol (ULTRAM) 50 MG tablet Take 50 mg by mouth every 6 (six) hours as needed.       Discharge Medications: Please see discharge summary for a list of discharge medications.  Relevant Imaging Results:  Relevant Lab Results:   Additional Information SS# [id number]  - will arrange [facility] PT at the [place], RN

## 2021-05-16 NOTE — ED Notes (Signed)
VOL/awaiting TOC disposition

## 2021-05-16 NOTE — ED Notes (Signed)
Pt had large bowel movement. Peri-care performed and patient assisted back to bed. New sheets applied to bed and new gown provided.

## 2021-05-16 NOTE — ED Notes (Signed)
Pt express need to void, urinal emptied and given to pt. Pt said it spilt so brief and padding changed, pt warm, dry, and in no distress at this time.

## 2021-05-16 NOTE — TOC Progression Note (Signed)
Transition of Care [facility]) - Progression Note    Patient Details  Name: Peter Nunez MRN: [id number] Date of Birth: June 01, 1940  Transition of Care [facility]) CM/SW Contact  Shelbie Hutching, RN Phone Number: 05/16/2021, 12:44 PM  Clinical Narrative:     Damaris Schooner with Danielle with [employer] this morning about patient disposition.  Andee Poles is meeting with daughter, Milderd Meager today and they will be touring 2 facilities for long term care.  Will follow up with Andee Poles this afternoon or in the AM.   Expected Discharge Plan: Memory Care Barriers to Discharge: Other (must enter comment) (family will not pick up and COVID +)  Expected Discharge Plan and Services Expected Discharge Plan: Memory Care   Discharge Planning Services: CM Consult   Living arrangements for the past 2 months: Single Family Home                 DME Arranged: N/A DME Agency: NA       HH Arranged: NA HH Agency: NA         Social Determinants of Health (SDOH) Interventions    Readmission Risk Interventions No flowsheet data found.

## 2021-05-16 NOTE — ED Notes (Signed)
Pt given breakfast tray

## 2021-05-16 NOTE — Progress Notes (Signed)
Inpatient Diabetes Program Recommendations  AACE/ADA: New Consensus Statement on Inpatient Glycemic Control  Target Ranges:  Prepandial:   less than 140 mg/dL      Peak postprandial:   less than 180 mg/dL (1-2 hours)      Critically ill patients:  140 - 180 mg/dL    Latest Reference Range & Units 05/15/21 15:46 05/16/21 08:49  Glucose-Capillary 70 - 99 mg/dL 395 (H) 249 (H)   Review of Glycemic Control  Diabetes history: DM2 Outpatient Diabetes medications: Glipizide 5 mg BID, Metformin 500 mg BID Current orders for Inpatient glycemic control: Novolog 0-15 units TID with meals, Glipizide 5 mg BID, Metformin 500 mg BID; Prednisone taper  Inpatient Diabetes Program Recommendations:    Insulin: Noted Novolog correction scale ordered today.  Diet: Please consider discontinuing Regular diet and ordering Carb Modified diet.  Thanks, Barnie Alderman, RN, MSN, CDE Diabetes Coordinator Inpatient Diabetes Program [phone] (Team Pager from 8am to 5pm)

## 2021-05-16 NOTE — ED Notes (Signed)
This Probation officer and Abigail EDT assisted pt back into bed; pt resting comfortably with no needs voiced at this time.

## 2021-05-17 LAB — CBG MONITORING, ED
Glucose-Capillary: 410 mg/dL — ABNORMAL HIGH (ref 70–99)
Glucose-Capillary: 359 mg/dL — ABNORMAL HIGH (ref 70–99)

## 2021-05-17 NOTE — ED Provider Notes (Signed)
----------------------------------------- °  5:22 AM on 05/17/2021 -----------------------------------------   Blood pressure (!) 107/50, pulse (!) 50, temperature 97.7 F (36.5 C), resp. rate 17, weight 95.3 kg, SpO2 96 %.  The patient is calm and cooperative at this time.  There have been no acute events since the last update.  Awaiting disposition plan from social work team.   Paulette Blanch, MD 05/17/21 [phone]

## 2021-05-17 NOTE — ED Notes (Signed)
Pt wheeled to lobby by this Probation officer. Pt transported to group home.

## 2021-05-17 NOTE — ED Notes (Signed)
Informed EDP of CBG. Verbal orders for 15 units Novolog and to repeat CBG before lunch.

## 2021-05-17 NOTE — TOC Transition Note (Signed)
Transition of Care [facility]) - CM/SW Discharge Note   Patient Details  Name: Peter Nunez MRN: [id number] Date of Birth: 24-Sep-1940  Transition of Care [facility]) CM/SW Contact:  Shelbie Hutching, RN Phone Number: 05/17/2021, 8:38 AM   Clinical Narrative:    Patient will discharge today to family [place].  Facility will be picking patient up after 12 noon today once they have met with patient's daughter and collected payment and had admission forms signed.  Chest X ray completed and COVID still positive but this to be expected, he is outside of quarantine period and not having any symptoms.     Final next level of care: Group Home Barriers to Discharge: Barriers Resolved   Patient Goals and CMS Choice Patient states their goals for this hospitalization and ongoing recovery are:: Agrees with Family Care home CMS Medicare.gov Compare Post Acute Care list provided to:: Patient Represenative (must comment) Choice offered to / list presented to : Adult Children  Discharge Placement              Patient chooses bed at: Other - please specify in the comment section below: ([facility]) Patient to be transferred to facility by: FAcility will transfer Name of family member notified: Salvadore Dom Patient and family notified of of transfer: 05/17/21  Discharge Plan and Services   Discharge Planning Services: CM Consult            DME Arranged: N/A DME Agency: NA       HH Arranged: NA HH Agency: NA        Social Determinants of Health (SDOH) Interventions     Readmission Risk Interventions No flowsheet data found.

## 2021-05-17 NOTE — ED Notes (Signed)
Patient walked out of room with walker stating, "I am leaving and going to my room."  Patient redirected to room by Probation officer and fellow RN Radonna Ricker.

## 2021-05-17 NOTE — ED Notes (Addendum)
Discharged with Peter Nunez from [facility]. Pt sent home with all of belongings including phone.  [name],[name] Daughter [phone]    Called up update on disposition. No answer, left HIPPA compliant message to return phone call.

## 2021-05-17 NOTE — Progress Notes (Signed)
Inpatient Diabetes Program Recommendations  AACE/ADA: New Consensus Statement on Inpatient Glycemic Control   Target Ranges:  Prepandial:   less than 140 mg/dL      Peak postprandial:   less than 180 mg/dL (1-2 hours)      Critically ill patients:  140 - 180 mg/dL    Latest Reference Range & Units 05/16/21 08:49 05/16/21 16:38  Glucose-Capillary 70 - 99 mg/dL 249 (H) 339 (H)   Review of Glycemic Control  Diabetes history: DM2 Outpatient Diabetes medications: Glipizide 5 mg BID, Metformin 500 mg BID Current orders for Inpatient glycemic control: Novolog 0-15 units TID with meals, Glipizide 5 mg BID, Metformin 500 mg BID; Prednisone taper  Inpatient Diabetes Program Recommendations:    Insulin: Please consider ordering Novolog 0-5 units QHS for bedtime correction.  Thanks, Barnie Alderman, RN, MSN, CDE Diabetes Coordinator Inpatient Diabetes Program [phone] (Team Pager from 8am to 5pm)

## 2022-02-01 ENCOUNTER — Emergency Department
Admission: EM | Admit: 2022-02-01 | Discharge: 2022-02-01 | Discharge status: Against Medical Advice | Payer: Medicare HMO | Attending: Emergency Medicine | Admitting: Emergency Medicine

## 2022-02-01 DIAGNOSIS — Z5321 Procedure and treatment not carried out due to patient leaving prior to being seen by health care provider: Secondary | ICD-10-CM | POA: Insufficient documentation

## 2022-02-01 DIAGNOSIS — R42 Dizziness and giddiness: Secondary | ICD-10-CM | POA: Diagnosis present

## 2022-02-01 DIAGNOSIS — I10 Essential (primary) hypertension: Secondary | ICD-10-CM | POA: Diagnosis not present

## 2022-02-01 LAB — BASIC METABOLIC PANEL
Anion gap: 13 (ref 5–15)
BUN: 56 mg/dL — ABNORMAL HIGH (ref 8–23)
CO2: 21 mmol/L — ABNORMAL LOW (ref 22–32)
Calcium: 9.8 mg/dL (ref 8.9–10.3)
Chloride: 104 mmol/L (ref 98–111)
Creatinine, Ser: 3.57 mg/dL — ABNORMAL HIGH (ref 0.61–1.24)
GFR, Estimated: 17 mL/min — ABNORMAL LOW (ref >60)
Glucose, Bld: 82 mg/dL (ref 70–99)
Potassium: 5.2 mmol/L — ABNORMAL HIGH (ref 3.5–5.1)
Sodium: 138 mmol/L (ref 135–145)

## 2022-02-01 LAB — CBC
HCT: 35.1 % — ABNORMAL LOW (ref 39.0–52.0)
Hemoglobin: 11.4 g/dL — ABNORMAL LOW (ref 13.0–17.0)
MCH: 29.8 pg (ref 26.0–34.0)
MCHC: 32.5 g/dL (ref 30.0–36.0)
MCV: 91.6 fL (ref 80.0–100.0)
Platelets: 229 10*3/uL (ref 150–400)
RBC: 3.83 MIL/uL — ABNORMAL LOW (ref 4.22–5.81)
RDW: 12.5 % (ref 11.5–15.5)
WBC: 11.5 10*3/uL — ABNORMAL HIGH (ref 4.0–10.5)
nRBC: 0 % (ref 0.0–0.2)

## 2022-02-01 NOTE — ED Triage Notes (Signed)
PT CHECKED IN FOR HYPOTENSION , HE HOWEVER HAS HTN , PT STATES PAST FEW DAYS HE HAS BEEN FEELING DIZZY

## 2022-02-01 NOTE — ED Notes (Signed)
Pts family to the front desk to inform this RN that the patient is "feeling better" and wants to leave. Pt and family seen exiting the lobby.

## 2022-02-02 ENCOUNTER — Telehealth: Payer: Self-pay | Admitting: Emergency Medicine

## 2024-06-24 NOTE — Progress Notes (Unsigned)
" ° °  06/24/24 9:56 AM   Peter Nunez December 08, 1940 [id number]   HPI: 84 y.o. male here for initial evaluation of ***  Saw PCP in December 2025 for dysuria UA negative, no blood Referred to urology [facility] [place]   History of DM2 (A1c 8.6%), CKD stage III   PMH: Past Medical History:  Diagnosis Date   Diabetes mellitus (HCC)    HTN (hypertension)    Hyperlipidemia    Stroke [facility])     Surgical History: Past Surgical History:  Procedure Laterality Date   DECORTICATION  10/23/2011   Procedure: DECORTICATION;  Surgeon: Dorise MARLA Fellers, MD;  Location: [facility] OR;  Service: Thoracic;  Laterality: Right;   FLEXIBLE BRONCHOSCOPY  10/23/2011   Procedure: FLEXIBLE BRONCHOSCOPY;  Surgeon: Dorise MARLA Fellers, MD;  Location: [facility] OR;  Service: Thoracic;  Laterality: N/A;   PLEURAL EFFUSION DRAINAGE  10/23/2011   Procedure: DRAINAGE OF PLEURAL EFFUSION;  Surgeon: Dorise MARLA Fellers, MD;  Location: [facility] OR;  Service: Thoracic;  Laterality: Right;   THORACOTOMY  10/23/2011   Procedure: THORACOTOMY MAJOR;  Surgeon: Dorise MARLA Fellers, MD;  Location: [facility] OR;  Service: Thoracic;  Laterality: Right;    Family History: No family history on file.  Social History:  reports that he quit smoking about 43 years ago. His smoking use included cigarettes. He started smoking about 63 years ago. He has a 20 pack-year smoking history. He does not have any smokeless tobacco history on file. He reports current alcohol use. He reports that he does not use drugs.      Physical Exam: There were no vitals taken for this visit.   Constitutional:  Alert and oriented, No acute distress. Cardiovascular: No clubbing, cyanosis, or edema. Respiratory: Normal respiratory effort, no increased work of breathing. GI: Nondistended GU: *** Skin: No rashes, bruises or suspicious lesions. Neurologic: Grossly intact, no focal deficits, moving all 4 extremities. Psychiatric: Normal mood and affect.  Laboratory Data:  1 mo ago   Color, UA Yellow   Clarity, UA Clear  Glucose, UA Negative Negative  Bilirubin, UA Negative Negative  Ketones, POC Negative Negative  Spec Grav, UA 1.005 - 1.030 1.020  Blood, UA Negative Negative  pH, UA 5.0 - 9.0 5.5  Protein, UA Negative 1+ Abnormal   Urobilinogen, UA Negative (0.2 mg/dL) 0.2 E.U./dL  Leukocytes, UA Negative Trace Abnormal   Nitrite, UA Negative Negative   Creatinine 0.73 - 1.18 mg/dL 7.99 High     Pertinent Imaging: N/A    Assessment & Plan:    There are no diagnoses linked to this encounter.    Penne Skye, MD 06/24/2024  [facility] [address] [place], [place] [address] [phone] "

## 2024-07-01 ENCOUNTER — Ambulatory Visit: Admitting: Urology
